# Patient Record
Sex: Female | Born: 1995 | Race: White | Hispanic: No | Marital: Married | Smoking: Never smoker
Health system: Southern US, Community
[De-identification: ages and names within clinical notes are randomized; demographics above are authoritative.]

## PROBLEM LIST (undated history)

## (undated) DIAGNOSIS — R630 Anorexia: Secondary | ICD-10-CM

## (undated) DIAGNOSIS — L509 Urticaria, unspecified: Secondary | ICD-10-CM

## (undated) DIAGNOSIS — F502 Bulimia nervosa, unspecified: Secondary | ICD-10-CM

## (undated) DIAGNOSIS — F419 Anxiety disorder, unspecified: Secondary | ICD-10-CM

## (undated) DIAGNOSIS — E559 Vitamin D deficiency, unspecified: Secondary | ICD-10-CM

## (undated) DIAGNOSIS — F32A Depression, unspecified: Secondary | ICD-10-CM

## (undated) DIAGNOSIS — F329 Major depressive disorder, single episode, unspecified: Secondary | ICD-10-CM

## (undated) DIAGNOSIS — R300 Dysuria: Secondary | ICD-10-CM

## (undated) HISTORY — PX: NO PAST SURGERIES: SHX2092

## (undated) HISTORY — PX: TONSILLECTOMY: SUR1361

## (undated) HISTORY — PX: ADENOIDECTOMY: SUR15

## (undated) HISTORY — PX: SINOSCOPY: SHX187

## (undated) HISTORY — DX: Dysuria: R30.0

## (undated) HISTORY — PX: TYMPANOSTOMY TUBE PLACEMENT: SHX32

## (undated) HISTORY — DX: Urticaria, unspecified: L50.9

## (undated) HISTORY — DX: Vitamin D deficiency, unspecified: E55.9

---
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
## 2016-07-13 ENCOUNTER — Encounter (INDEPENDENT_AMBULATORY_CARE_PROVIDER_SITE_OTHER): Payer: Self-pay

## 2016-07-13 ENCOUNTER — Ambulatory Visit (INDEPENDENT_AMBULATORY_CARE_PROVIDER_SITE_OTHER): Payer: 59 | Admitting: Allergy & Immunology

## 2016-07-13 ENCOUNTER — Encounter: Payer: Self-pay | Admitting: Allergy & Immunology

## 2016-07-13 VITALS — BP 96/62 | HR 103 | Temp 97.8°F | Resp 20 | Ht 61.5 in | Wt 100.2 lb

## 2016-07-13 DIAGNOSIS — L508 Other urticaria: Secondary | ICD-10-CM

## 2016-07-13 LAB — COMPLETE METABOLIC PANEL WITH GFR
ALBUMIN: 4.5 g/dL (ref 3.6–5.1)
ALK PHOS: 53 U/L (ref 33–115)
ALT: 18 U/L (ref 6–29)
AST: 18 U/L (ref 10–30)
BUN: 12 mg/dL (ref 7–25)
CHLORIDE: 106 mmol/L (ref 98–110)
CO2: 22 mmol/L (ref 20–31)
Calcium: 9.4 mg/dL (ref 8.6–10.2)
Creat: 0.6 mg/dL (ref 0.50–1.10)
GFR, Est African American: >89 mL/min (ref >=60)
GLUCOSE: 57 mg/dL — AB (ref 65–99)
POTASSIUM: 3.9 mmol/L (ref 3.5–5.3)
SODIUM: 140 mmol/L (ref 135–146)
Total Bilirubin: 0.4 mg/dL (ref 0.2–1.2)
Total Protein: 6.9 g/dL (ref 6.1–8.1)

## 2016-07-13 LAB — CBC WITH DIFFERENTIAL/PLATELET
BASOS ABS: 0 {cells}/uL (ref 0–200)
Basophils Relative: 0 %
EOS ABS: 42 {cells}/uL (ref 15–500)
EOS PCT: 1 %
HEMATOCRIT: 39.5 % (ref 35.0–45.0)
HEMOGLOBIN: 12.8 g/dL (ref 11.7–15.5)
LYMPHS ABS: 1680 {cells}/uL (ref 850–3900)
Lymphocytes Relative: 40 %
MCH: 27.6 pg (ref 27.0–33.0)
MCHC: 32.4 g/dL (ref 32.0–36.0)
MCV: 85.3 fL (ref 80.0–100.0)
MONO ABS: 336 {cells}/uL (ref 200–950)
MPV: 11.7 fL (ref 7.5–12.5)
Monocytes Relative: 8 %
NEUTROS ABS: 2142 {cells}/uL (ref 1500–7800)
NEUTROS PCT: 51 %
Platelets: 163 10*3/uL (ref 140–400)
RBC: 4.63 MIL/uL (ref 3.80–5.10)
RDW: 13.5 % (ref 11.0–15.0)
WBC: 4.2 10*3/uL (ref 3.8–10.8)

## 2016-07-13 NOTE — Patient Instructions (Addendum)
1. Chronic urticaria (hives) - Testing today showed: mildly positive reactions to ragweed, oak, and horse - These were not robust and I doubt they are related to your hives. - We will get labs to rule out serious causes of hives: CBC with Differential/Platelet, Sed Rate (ESR), C-reactive protein, Chronic Urticaria index, Tryptase, Complete Metabolic Panel - The history is not suggestive of a food trigger and she has tested negative to all of the major food allergens via blood testing (which has a good negative predictive value), therefore we did not perform additional food testing today. - There are no reg flags in the history such as fever, joint pains, permanent skin changes. - The first line treatment of chronic hives is suppressive dosing of antihistamines, so continue with Allegra (fexofenadine) 1-2 tablets in the morning and Xyzal (levocetirizine) 1-2 tablets at night. - Wean medications as tolerated at home. - If you do not want to take medications on a daily basis, we could discuss starting a monthly injectable medication call Xolair.  2. Return in about 3 months (around 10/11/2016).  Please inform us of any Emergency Department visits, hospitalizations, or changes in symptoms. Call us before going to the ED for breathing or allergy symptoms since we might be able to fit you in for a sick visit. Feel free to contact us anytime with any questions, problems, or concerns.  It was a pleasure to meet you and your family today! Have a wonderful holiday season!   Websites that have reliable patient information: 1. American Academy of Asthma, Allergy, and Immunology: www.aaaai.org 2. Food Allergy Research and Education (FARE): foodallergy.org 3. Mothers of Asthmatics: http://www.asthmacommunitynetwork.org 4. American College of Allergy, Asthma, and Immunology: www.acaai.org  Reducing Pollen Exposure  The American Academy of Allergy, Asthma and Immunology suggests the following steps to reduce  your exposure to pollen during allergy seasons.    1. Do not hang sheets or clothing out to dry; pollen may collect on these items. 2. Do not mow lawns or spend time around freshly cut grass; mowing stirs up pollen. 3. Keep windows closed at night.  Keep car windows closed while driving. 4. Minimize morning activities outdoors, a time when pollen counts are usually at their highest. 5. Stay indoors as much as possible when pollen counts or humidity is high and on windy days when pollen tends to remain in the air longer. 6. Use air conditioning when possible.  Many air conditioners have filters that trap the pollen spores. 7. Use a HEPA room air filter to remove pollen form the indoor air you breathe.

## 2016-07-13 NOTE — Progress Notes (Signed)
NEW PATIENT  Date of Service/Encounter:  07/13/16   Assessment:   Chronic urticaria  Plan/Recommendations:   1. Chronic urticaria (hives) - Testing today showed: mildly positive reactions to ragweed, oak, and horse - These were not robust and I doubt they are related to your hives. - We will get labs to rule out serious causes of hives: CBC with Differential/Platelet, Sed Rate (ESR), C-reactive protein, Chronic Urticaria index, Tryptase, Complete Metabolic Panel - The history is not suggestive of a food trigger and she has tested negative to all of the major food allergens via blood testing (which has a good negative predictive value), therefore we did not perform additional food testing today. - There are no reg flags in the history such as fever, joint pains, permanent skin changes. - The first line treatment of chronic hives is suppressive dosing of antihistamines, so continue with Allegra (fexofenadine) 1-2 tablets in the morning and Xyzal (levocetirizine) 1-2 tablets at night. - Wean medications as tolerated at home. - If you do not want to take medications on a daily basis, we could discuss starting a monthly injectable medication call Xolair.  2. Return in about 3 months (around 10/11/2016).    Subjective:   Stacie Eaton is a 20 y.o. female presenting today for evaluation of  Chief Complaint  Patient presents with  . Allergic Reaction    hard bumbs in hands and feet then hives on the rest of her body- last outbreak 3 days ago  .  Stacie Eaton has a history of the following: Patient Active Problem List   Diagnosis Date Noted  . Chronic urticaria 07/13/2016    History obtained from: chart review and patient and her mother.  Stacie Eaton was referred by Charline Bills, MD.     Stacie Eaton is a 20 y.o. female presenting for evaluation of urticaria. These have been ongoing for the past three months. She was hanging out at the house and they started out of nowhere. Right  before it happened, she was switched to a different birth control. But she switched back without any change. She was having no systemic symptoms. It was very pruritic but they do not have pictures. Rash started on the bottoms of her feet. It has since remained on the feet and hands with occasional spreading to the arms and the legs. Rash resolution leaves normal schools. t is definitely painful on the hands and the feet but itchy on the arms and legs. She has tried treating with Allegra one tablet once daily as well as ranitidine, one of each daily. This typically helps resolve the symptoms but has not resolved completely. She is having no fevers with these or joint pains. No one else in the family has these and she has never had hives before. She does not notice any worsening with foods but she notices that it only happens in her house. It resolves when she is outside of the home. She has two dogs at home as well as a rabbit. She has no mold or roach issues. She tolerates all of the major allergens.   The patient established care with Dr. Charline Bills in October 2017. At that time, according to his note, the rest (on for about 1 month. She was previously started on cimetidine and loratadine by another provider without resolution. She has not been seen by dermatology. She did have allergy testing to environmental allergens as well as foods sent in October 2017 which was negative to everything. I do not see  a recent CBC.  Otherwise, there is no history of other atopic diseases, including asthma, drug allergies, food allergies, environmental allergies, or stinging insect allergies. There is no significant infectious history. Vaccinations are up to date.    Past Medical History: Patient Active Problem List   Diagnosis Date Noted  . Chronic urticaria 07/13/2016    Medication List:    Medication List       Accurate as of 07/13/16 11:57 AM. Always use your most recent med list.          cimetidine  400 MG tablet Commonly known as:  TAGAMET Take 400 mg by mouth daily.   EPINEPHrine 0.3 mg/0.3 mL Soaj injection Commonly known as:  EPI-PEN Inject 0.3 mg into the muscle as needed.   fexofenadine 180 MG tablet Commonly known as:  ALLEGRA Take 180 mg by mouth daily.   LUTERA 0.1-20 MG-MCG tablet Generic drug:  levonorgestrel-ethinyl estradiol Take 20 mg by mouth daily.       Birth History: non-contributory. Born at term without complications.   Developmental History: Stacie Eaton has met all milestones on time. She has required no speech therapy, occupational therapy, or physical therapy.   Past Surgical History: Past Surgical History:  Procedure Laterality Date  . ADENOIDECTOMY    . SINOSCOPY    . TONSILLECTOMY    . TYMPANOSTOMY TUBE PLACEMENT       Family History: Family History  Problem Relation Age of Onset  . Allergic rhinitis Neg Hx   . Angioedema Neg Hx   . Asthma Neg Hx   . Eczema Neg Hx   . Immunodeficiency Neg Hx   . Urticaria Neg Hx    There is no family history of immunodeficiency or autoimmunity. There is no family history of urticaria at all.    Social History: Stacie Eaton lives at home with Her mother and father. They live in a house that is 76 years old. There is carpeting throughout the home. There is no mildew or roach problems in the home. She does not use dust mite covers on her better pillows. There is no tobacco exposure. There are 2 dogs and one rabbit in the home. She currently works as a Educational psychologist at [employer]. She is planning to go to the [employer] and get an undergraduate in Education officer, museum. She already has an associates degree which she obtained while she was in high school. She is planning to get a master's in botany.   Review of Systems: a 14-point review of systems is pertinent for what is mentioned in HPI.  Otherwise, all other systems were negative. Constitutional: negative other than that listed in the HPI Eyes: negative  other than that listed in the HPI Ears, nose, mouth, throat, and face: negative other than that listed in the HPI Respiratory: negative other than that listed in the HPI Cardiovascular: negative other than that listed in the HPI Gastrointestinal: negative other than that listed in the HPI Genitourinary: negative other than that listed in the HPI Integument: negative other than that listed in the HPI Hematologic: negative other than that listed in the HPI Musculoskeletal: negative other than that listed in the HPI Neurological: negative other than that listed in the HPI Allergy/Immunologic: negative other than that listed in the HPI    Objective:   Blood pressure 96/62, pulse (!) 103, temperature 97.8 F (36.6 C), temperature source Oral, resp. rate 20, height 5' 1.5" (1.562 m), weight 100 lb 3.2 oz (45.5 kg), SpO2 98 %. Body  mass index is 18.63 kg/m.   Physical Exam:  General: Alert, interactive, in no acute distress. Thin female. Cooperative with the exam. HEENT: TMs pearly gray, turbinates edematous without discharge, post-pharynx mildly erythematous. Neck: Supple without thyromegaly. Adenopathy: no enlarged lymph nodes appreciated in the anterior cervical, occipital, axillary, epitrochlear, inguinal, or popliteal regions Lungs: Clear to auscultation without wheezing, rhonchi or rales. No increased work of breathing. CV: Normal S1/S2, no murmurs. Capillary refill <2 seconds.  Abdomen: Nondistended, nontender. No guarding or rebound tenderness. Bowel sounds present in all fields and hyperactive  Skin: Warm and dry, without lesions or rashes. Dermatographism appreciated. There are no urticarial lesions appreciated today aside from when I performed dermatographism testing.  Extremities:  No clubbing, cyanosis or edema. Neuro:   Grossly intact.  Diagnostic studies:   Allergy Studies:   Indoor/Outdoor Percutaneous Adult Environmental Panel: mildly positive reactions to ragweed,  oak pollen, and horse with adequate controls     Salvatore Marvel, MD [facility]

## 2016-07-14 LAB — TRYPTASE: TRYPTASE: 3.4 ug/L (ref <11)

## 2016-07-14 LAB — C-REACTIVE PROTEIN: CRP: 0.4 mg/L (ref <8.0)

## 2016-07-14 LAB — SEDIMENTATION RATE: Sed Rate: 3 mm/hr (ref 0–20)

## 2016-07-21 LAB — CP CHRONIC URTICARIA INDEX PANEL
THYROID PEROXIDASE ANTIBODY: 1 [IU]/mL (ref <9)
TSH: 1.53 mIU/L

## 2016-10-12 ENCOUNTER — Ambulatory Visit: Payer: 59 | Admitting: Allergy & Immunology

## 2018-03-27 ENCOUNTER — Emergency Department (HOSPITAL_COMMUNITY)
Admission: EM | Admit: 2018-03-27 | Discharge: 2018-03-27 | Disposition: A | Discharge status: Home / Self Care | Payer: Self-pay | Attending: Emergency Medicine | Admitting: Emergency Medicine

## 2018-03-27 ENCOUNTER — Emergency Department (HOSPITAL_COMMUNITY): Payer: Self-pay

## 2018-03-27 ENCOUNTER — Other Ambulatory Visit: Payer: Self-pay

## 2018-03-27 ENCOUNTER — Encounter (HOSPITAL_COMMUNITY): Payer: Self-pay

## 2018-03-27 DIAGNOSIS — R0600 Dyspnea, unspecified: Secondary | ICD-10-CM | POA: Insufficient documentation

## 2018-03-27 DIAGNOSIS — R7989 Other specified abnormal findings of blood chemistry: Secondary | ICD-10-CM | POA: Insufficient documentation

## 2018-03-27 DIAGNOSIS — Z79899 Other long term (current) drug therapy: Secondary | ICD-10-CM | POA: Insufficient documentation

## 2018-03-27 LAB — CBC WITH DIFFERENTIAL/PLATELET
BASOS ABS: 0 10*3/uL (ref 0.0–0.1)
Basophils Relative: 0 %
EOS PCT: 2 %
Eosinophils Absolute: 0.1 10*3/uL (ref 0.0–0.7)
HEMATOCRIT: 39.6 % (ref 36.0–46.0)
Hemoglobin: 13.2 g/dL (ref 12.0–15.0)
LYMPHS ABS: 1.7 10*3/uL (ref 0.7–4.0)
Lymphocytes Relative: 32 %
MCH: 27.7 pg (ref 26.0–34.0)
MCHC: 33.3 g/dL (ref 30.0–36.0)
MCV: 83.2 fL (ref 78.0–100.0)
Monocytes Absolute: 0.3 10*3/uL (ref 0.1–1.0)
Monocytes Relative: 6 %
NEUTROS ABS: 3.3 10*3/uL (ref 1.7–7.7)
Neutrophils Relative %: 60 %
Platelets: 144 10*3/uL — ABNORMAL LOW (ref 150–400)
RBC: 4.76 MIL/uL (ref 3.87–5.11)
RDW: 13.2 % (ref 11.5–15.5)
WBC: 5.4 10*3/uL (ref 4.0–10.5)

## 2018-03-27 LAB — COMPREHENSIVE METABOLIC PANEL
ALK PHOS: 58 U/L (ref 38–126)
ALT: 16 U/L (ref 0–44)
AST: 17 U/L (ref 15–41)
Albumin: 4 g/dL (ref 3.5–5.0)
Anion gap: 10 (ref 5–15)
BILIRUBIN TOTAL: 0.4 mg/dL (ref 0.3–1.2)
BUN: 10 mg/dL (ref 6–20)
CALCIUM: 9 mg/dL (ref 8.9–10.3)
CO2: 23 mmol/L (ref 22–32)
CREATININE: 0.56 mg/dL (ref 0.44–1.00)
Chloride: 107 mmol/L (ref 98–111)
Glucose, Bld: 95 mg/dL (ref 70–99)
Potassium: 3.6 mmol/L (ref 3.5–5.1)
Sodium: 140 mmol/L (ref 135–145)
TOTAL PROTEIN: 7.3 g/dL (ref 6.5–8.1)

## 2018-03-27 LAB — I-STAT BETA HCG BLOOD, ED (NOT ORDERABLE)

## 2018-03-27 LAB — POCT I-STAT TROPONIN I: Troponin i, poc: 0 ng/mL (ref 0.00–0.08)

## 2018-03-27 LAB — D-DIMER, QUANTITATIVE (NOT AT ARMC): D DIMER QUANT: 0.56 ug{FEU}/mL — AB (ref 0.00–0.50)

## 2018-03-27 MED ORDER — IOPAMIDOL (ISOVUE-370) INJECTION 76%
INTRAVENOUS | Status: AC
  Filled 2018-03-27: qty 100

## 2018-03-27 MED ORDER — IOPAMIDOL (ISOVUE-370) INJECTION 76%
100.0000 mL | Freq: Once | INTRAVENOUS | Status: AC | PRN
  Administered 2018-03-27: 100 mL via INTRAVENOUS

## 2018-03-27 MED ORDER — ALBUTEROL SULFATE (2.5 MG/3ML) 0.083% IN NEBU
5.0000 mg | INHALATION_SOLUTION | Freq: Once | RESPIRATORY_TRACT | Status: DC
  Filled 2018-03-27: qty 6

## 2018-03-27 NOTE — ED Notes (Signed)
Pt updated on plan of care

## 2018-03-27 NOTE — Discharge Instructions (Signed)
Follow up with primary care doctor as needed.

## 2018-03-27 NOTE — ED Triage Notes (Signed)
Patient reports that she has had SOB x 1 week and has gotten progressively worse. Patient states she has SOB when walking across a room .

## 2018-03-27 NOTE — ED Provider Notes (Signed)
[facility]-EMERGENCY DEPT Provider Note   CSN: [id number] Arrival date & time: 03/27/18  1149     History   Chief Complaint Chief Complaint  Patient presents with  . Shortness of Breath    HPI Stacie Eaton is a 22 y.o. female.  The history is provided by the patient.  Shortness of Breath  This is a new problem. The average episode lasts 1 week. The problem occurs continuously.Episode onset: 1 week ago. The problem has been gradually worsening. Associated symptoms include rhinorrhea and cough. Pertinent negatives include no fever, no sputum production, no wheezing, no orthopnea, no syncope, no vomiting, no abdominal pain, no leg pain and no leg swelling. Associated symptoms comments: Exertional dypsnea only.  No dyspnea at rest.  No chest pain.  Over the last week Stacie Eaton has had a runny nose but no really cold like symptoms. It is unknown what precipitated the problem. Treatments tried: rest. The treatment provided no relief. Stacie Eaton has had no prior hospitalizations. Associated medical issues do not include asthma, PE, heart failure or DVT. Associated medical issues comments: pt does take OCP's.  last pregnant and delivered healthy baby 1 year ago..    Past Medical History:  Diagnosis Date  . Urticaria     Patient Active Problem List   Diagnosis Date Noted  . Chronic urticaria 07/13/2016    History reviewed. No pertinent surgical history.   OB History   None      Home Medications    Prior to Admission medications   Medication Sig Start Date End Date Taking? Authorizing Provider  norethindrone-ethinyl estradiol (JUNEL FE,GILDESS FE,LOESTRIN FE) 1-20 MG-MCG tablet Take 1 tablet by mouth daily.   Yes [provider]  Pseudoephedrine-APAP-DM (DAYQUIL PO) Take 2 tablets by mouth daily as needed (cold symptoms).   Yes [provider]  cimetidine (TAGAMET) 400 MG tablet Take 400 mg by mouth daily. 04/24/16   [provider]     Family History Family History  Problem Relation Age of Onset  . Allergic rhinitis Neg Hx   . Angioedema Neg Hx   . Asthma Neg Hx   . Eczema Neg Hx   . Immunodeficiency Neg Hx   . Urticaria Neg Hx     Social History Social History   Tobacco Use  . Smoking status: Never Smoker  . Smokeless tobacco: Never Used  Substance Use Topics  . Alcohol use: Yes    Comment: rarely  . Drug use: Never     Allergies   Patient has no known allergies.   Review of Systems Review of Systems  Constitutional: Negative for fever.  HENT: Positive for rhinorrhea.   Respiratory: Positive for cough and shortness of breath. Negative for sputum production and wheezing.   Cardiovascular: Negative for orthopnea, leg swelling and syncope.  Gastrointestinal: Negative for abdominal pain and vomiting.  All other systems reviewed and are negative.    Physical Exam Updated Vital Signs BP 119/85 (BP Location: Left Arm)   Pulse (!) 112   Temp 98.5 F (36.9 C) (Oral)   Resp (!) 24   Ht 5\' 1"  (1.549 m)   Wt 49.9 kg   LMP 03/13/2018   SpO2 97%   BMI 20.78 kg/m   Physical Exam  Constitutional: Stacie Eaton is oriented to person, place, and time. Stacie Eaton appears well-developed and well-nourished. No distress.  HENT:  Head: Normocephalic and atraumatic.  Mouth/Throat: Oropharynx is clear and moist.  Eyes: Pupils are equal, round, and reactive to light.  Conjunctivae and EOM are normal.  Neck: Normal range of motion. Neck supple.  Cardiovascular: Regular rhythm and intact distal pulses. Tachycardia present.  No murmur heard. Pulmonary/Chest: Effort normal and breath sounds normal. Tachypnea noted. No respiratory distress. Stacie Eaton has no wheezes. Stacie Eaton has no rales.  Mild decreased breath sounds in lower lobe but no wheezing present  Abdominal: Soft. Stacie Eaton exhibits no distension. There is no tenderness. There is no rebound and no guarding.  Musculoskeletal: Normal range of motion. Stacie Eaton exhibits no edema or  tenderness.  Neurological: Stacie Eaton is alert and oriented to person, place, and time.  Skin: Skin is warm and dry. No rash noted. No erythema.  Psychiatric: Stacie Eaton has a normal mood and affect. Her behavior is normal.  Nursing note and vitals reviewed.    ED Treatments / Results  Labs (all labs ordered are listed, but only abnormal results are displayed) Labs Reviewed  CBC WITH DIFFERENTIAL/PLATELET  COMPREHENSIVE METABOLIC PANEL  D-DIMER, QUANTITATIVE (NOT AT [facility])  I-STAT TROPONIN, ED  I-STAT BETA HCG BLOOD, ED ([facility], [facility], [facility] ONLY)    EKG EKG Interpretation  Date/Time:  Tuesday March 27 2018 12:05:56 EDT Ventricular Rate:  106 PR Interval:    QRS Duration: 85 QT Interval:  411 QTC Calculation: 546 R Axis:   83 Text Interpretation:  Sinus tachycardia Right atrial enlargement RSR' in V1 or V2, probably normal variant Borderline repolarization abnormality Prolonged QT interval No previous tracing Confirmed by Gwyneth SproutPlunkett, Iverson Sees ([id number]) on 03/27/2018 1:11:17 PM   Radiology Dg Chest 2 View  Result Date: 03/27/2018 CLINICAL DATA:  Short of breath for a week, progressively worsening especially with exertion EXAM: CHEST - 2 VIEW COMPARISON:  None. FINDINGS: No active infiltrate or effusion is seen. Rounded artifacts overlie the upper lungs and the left lower lateral chest. Mediastinal and hilar contours are unremarkable. The heart is within normal limits in size. No acute bony abnormality is seen. IMPRESSION: No active cardiopulmonary disease. Electronically Signed   By: Dwyane DeePaul  Barry M.D.   On: 03/27/2018 12:42    Procedures Procedures (including critical care time)  Medications Ordered in ED Medications - No data to display   Initial Impression / Assessment and Plan / ED Course  I have reviewed the triage vital signs and the nursing notes.  Pertinent labs & imaging results that were available during my care of the patient were reviewed by me and considered in my medical decision making  (see chart for details).    Healthy 22 year old female with a history of estrogen use presenting with 1 week of worsening exertional dyspnea.  Patient states Stacie Eaton feels better when Stacie Eaton lays down.  It is only exertional when Stacie Eaton feels short of breath.  Stacie Eaton denies any chest pain.  Stacie Eaton has had some runny nose over the last week but denies fever productive cough.  Stacie Eaton has no history of asthma or tobacco use.  Stacie Eaton has no lower extremity swelling.  Patient has no symptoms suggestive of heart failure, myocarditis.  Concern for possible PE as patient does have tachycardia, dyspnea will exertion and an abnormal EKG with some right heart strain.  Patient has no wheezing on exam but does have mildly decreased breath sounds in the lower lobe so this could be reactive.  CBC, BMP, troponin and d-dimer are pending.  Final Clinical Impressions(s) / ED Diagnoses   Final diagnoses:  None    ED Discharge Orders    None       Gwyneth SproutPlunkett, Lavender Stanke, MD 03/27/18 2025

## 2018-03-27 NOTE — ED Provider Notes (Signed)
  Physical Exam  BP 101/65 (BP Location: Right Arm)   Pulse 83   Temp 98.1 F (36.7 C) (Oral)   Resp 16   Ht 5\' 1"  (1.549 m)   Wt 49.9 kg   LMP 03/13/2018   SpO2 100%   BMI 20.78 kg/m   Physical Exam  ED Course/Procedures     Procedures  MDM  Received patient in signout.  Shortness of breath with exertion over the last week.  Work-up overall reassuring.  D-dimer mildly elevated.  CT scan done reassuring.  Will discharge home for outpatient follow-up.       Benjiman CorePickering, Xaiden Fleig, MD 03/27/18 [phone]

## 2018-07-23 ENCOUNTER — Other Ambulatory Visit (HOSPITAL_COMMUNITY): Payer: Self-pay | Admitting: Nurse Practitioner

## 2018-07-23 DIAGNOSIS — Z3A13 13 weeks gestation of pregnancy: Secondary | ICD-10-CM

## 2018-07-23 DIAGNOSIS — Z369 Encounter for antenatal screening, unspecified: Secondary | ICD-10-CM

## 2018-07-23 LAB — OB RESULTS CONSOLE ABO/RH: RH Type: POSITIVE

## 2018-07-23 LAB — OB RESULTS CONSOLE ANTIBODY SCREEN: Antibody Screen: NEGATIVE

## 2018-07-24 ENCOUNTER — Encounter (HOSPITAL_COMMUNITY): Payer: Self-pay | Admitting: *Deleted

## 2018-07-27 ENCOUNTER — Encounter (HOSPITAL_COMMUNITY): Payer: Self-pay

## 2018-07-27 ENCOUNTER — Ambulatory Visit (HOSPITAL_COMMUNITY)
Admission: RE | Admit: 2018-07-27 | Discharge: 2018-07-27 | Disposition: A | Discharge status: Home / Self Care | Payer: Self-pay | Source: Ambulatory Visit | Attending: Nurse Practitioner | Admitting: Nurse Practitioner

## 2018-07-27 ENCOUNTER — Other Ambulatory Visit (HOSPITAL_COMMUNITY): Payer: Self-pay

## 2018-07-27 HISTORY — DX: Major depressive disorder, single episode, unspecified: F32.9

## 2018-07-27 HISTORY — DX: Depression, unspecified: F32.A

## 2018-08-08 NOTE — L&D Delivery Note (Addendum)
Patient: Annastasia Haskins MRN: [id number]  GBS status: Negative  Patient is a 23 y.o. now G2P2002 s/p NSVD at [redacted]w[redacted]d, who was admitted for IOL due to [place]lig[place]. SROM prior to delivery at unknown time.   Delivery Note At 10:39 PM a viable female was delivered via Vaginal, Spontaneous (Presentation: R OA).  APGAR: 8, 9; weight pending.   Placenta status: Spontaneous, intact Cord: 3 vessel, intact with the following complications: nuchal cord x1.   Anesthesia: None for delivery, lidocaine for repair  Episiotomy:  None Lacerations: 2nd degree perineal  Suture Repair: 3.0 vicryl Est. Blood Loss (mL): 385  Head delivered ROA. Nuchal cord x1 present. Shoulder and body delivered in usual fashion. Infant with spontaneous cry, placed on mother's abdomen, dried and bulb suctioned. Cord clamped x 2 after 1-minute delay, and cut by family member. Cord blood drawn. Placenta delivered spontaneously with gentle cord traction. Fundus firm with massage and Pitocin. Perineum inspected and found to have 2nd degree laceration, which was repaired with 3.0 vicryl with good hemostasis achieved.  Mom to postpartum.  Baby to Couplet care / Skin to Skin.  Patriciaann Clan 02/18/2019, 11:24 PM  I was gloved and present for entire delivery SVD without incident No difficulty with shoulders Lacerations as listed above Repair of same supervised by me  Mallie Snooks, CNM 02/19/19 1:03 AM

## 2019-01-03 DIAGNOSIS — D696 Thrombocytopenia, unspecified: Secondary | ICD-10-CM | POA: Diagnosis not present

## 2019-01-03 DIAGNOSIS — Z3483 Encounter for supervision of other normal pregnancy, third trimester: Secondary | ICD-10-CM | POA: Diagnosis not present

## 2019-01-03 DIAGNOSIS — D649 Anemia, unspecified: Secondary | ICD-10-CM | POA: Diagnosis not present

## 2019-01-03 DIAGNOSIS — Z8659 Personal history of other mental and behavioral disorders: Secondary | ICD-10-CM | POA: Diagnosis not present

## 2019-01-03 DIAGNOSIS — Z23 Encounter for immunization: Secondary | ICD-10-CM | POA: Diagnosis not present

## 2019-01-03 DIAGNOSIS — Z130 Encounter for screening for diseases of the blood and blood-forming organs and certain disorders involving the immune mechanism: Secondary | ICD-10-CM | POA: Diagnosis not present

## 2019-01-03 DIAGNOSIS — B081 Molluscum contagiosum: Secondary | ICD-10-CM | POA: Diagnosis not present

## 2019-01-17 DIAGNOSIS — D696 Thrombocytopenia, unspecified: Secondary | ICD-10-CM | POA: Diagnosis not present

## 2019-01-17 DIAGNOSIS — D649 Anemia, unspecified: Secondary | ICD-10-CM | POA: Diagnosis not present

## 2019-01-17 DIAGNOSIS — Z23 Encounter for immunization: Secondary | ICD-10-CM | POA: Diagnosis not present

## 2019-01-17 DIAGNOSIS — Z3483 Encounter for supervision of other normal pregnancy, third trimester: Secondary | ICD-10-CM | POA: Diagnosis not present

## 2019-01-17 DIAGNOSIS — Z8659 Personal history of other mental and behavioral disorders: Secondary | ICD-10-CM | POA: Diagnosis not present

## 2019-01-17 DIAGNOSIS — B081 Molluscum contagiosum: Secondary | ICD-10-CM | POA: Diagnosis not present

## 2019-01-17 DIAGNOSIS — Z130 Encounter for screening for diseases of the blood and blood-forming organs and certain disorders involving the immune mechanism: Secondary | ICD-10-CM | POA: Diagnosis not present

## 2019-01-17 LAB — OB RESULTS CONSOLE GBS: GBS: NEGATIVE

## 2019-01-31 DIAGNOSIS — Z8659 Personal history of other mental and behavioral disorders: Secondary | ICD-10-CM | POA: Diagnosis not present

## 2019-01-31 DIAGNOSIS — Z130 Encounter for screening for diseases of the blood and blood-forming organs and certain disorders involving the immune mechanism: Secondary | ICD-10-CM | POA: Diagnosis not present

## 2019-01-31 DIAGNOSIS — D649 Anemia, unspecified: Secondary | ICD-10-CM | POA: Diagnosis not present

## 2019-01-31 DIAGNOSIS — Z3483 Encounter for supervision of other normal pregnancy, third trimester: Secondary | ICD-10-CM | POA: Diagnosis not present

## 2019-01-31 DIAGNOSIS — Z23 Encounter for immunization: Secondary | ICD-10-CM | POA: Diagnosis not present

## 2019-01-31 DIAGNOSIS — B081 Molluscum contagiosum: Secondary | ICD-10-CM | POA: Diagnosis not present

## 2019-01-31 DIAGNOSIS — D696 Thrombocytopenia, unspecified: Secondary | ICD-10-CM | POA: Diagnosis not present

## 2019-02-03 ENCOUNTER — Inpatient Hospital Stay (HOSPITAL_COMMUNITY)
Admission: AD | Admit: 2019-02-03 | Discharge: 2019-02-03 | Disposition: A | Discharge status: Home / Self Care | Payer: Medicaid Other | Attending: Obstetrics and Gynecology | Admitting: Obstetrics and Gynecology

## 2019-02-03 ENCOUNTER — Encounter (HOSPITAL_COMMUNITY): Payer: Self-pay | Admitting: *Deleted

## 2019-02-03 DIAGNOSIS — Z3A38 38 weeks gestation of pregnancy: Secondary | ICD-10-CM | POA: Diagnosis not present

## 2019-02-03 DIAGNOSIS — Z3A40 40 weeks gestation of pregnancy: Secondary | ICD-10-CM | POA: Diagnosis not present

## 2019-02-03 DIAGNOSIS — O471 False labor at or after 37 completed weeks of gestation: Secondary | ICD-10-CM | POA: Diagnosis not present

## 2019-02-03 NOTE — MAU Note (Signed)
Pt arrived in MAU with c/o mild contractions every 3-4 min for the past hour. She says they do not hurt, just feel tight in her abdomen. Denies any vag bleeding or leaking

## 2019-02-03 NOTE — Progress Notes (Signed)
S: Stacie Eaton is a 23 y.o. G2P1001 at [redacted]w[redacted]d  who presents to MAU today complaining contractions q 5-10 minutes. She denies vaginal bleeding. She denies LOF. She reports normal fetal movement.    O: BP (!) 103/54 (BP Location: Right Arm)   Pulse (!) 123   Temp 98.3 F (36.8 C) (Oral)   Resp 15   Ht 5\' 1"  (1.549 m)   Wt (!) 610.1 kg   LMP 04/27/2018   BMI 254.14 kg/m  GENERAL: Well-developed, well-nourished female in no acute distress.  HEAD: Normocephalic, atraumatic.  CHEST: Normal effort of breathing, regular heart rate ABDOMEN: Soft, nontender, gravid  Cervical exam:  Dilation: 4 Effacement (%): 80 Cervical Position: Posterior Station: -3 Presentation: Vertex Exam by:: Gilmer Mor RN   Fetal Monitoring: Baseline: 140 Variability: moderate Accelerations: 15x15 Decelerations: none Contractions: occasional uc's   A: SIUP at [redacted]w[redacted]d  False labor  P: Discharge home -Labor precautions discussed -Patient advised to follow-up with [facility] as scheduled for prenatal care -Patient may return to MAU as needed or if her condition were to change or worsen   Wende Mott, [place] 02/03/2019 8:49 PM

## 2019-02-03 NOTE — Discharge Instructions (Signed)
Braxton Hicks Contractions Contractions of the uterus can occur throughout pregnancy, but they are not always a sign that you are in labor. You may have practice contractions called Braxton Hicks contractions. These false labor contractions are sometimes confused with true labor. What are Braxton Hicks contractions? Braxton Hicks contractions are tightening movements that occur in the muscles of the uterus before labor. Unlike true labor contractions, these contractions do not result in opening (dilation) and thinning of the cervix. Toward the end of pregnancy (32-34 weeks), Braxton Hicks contractions can happen more often and may become stronger. These contractions are sometimes difficult to tell apart from true labor because they can be very uncomfortable. You should not feel embarrassed if you go to the hospital with false labor. Sometimes, the only way to tell if you are in true labor is for your health care provider to look for changes in the cervix. The health care provider will do a physical exam and may monitor your contractions. If you are not in true labor, the exam should show that your cervix is not dilating and your water has not broken. If there are no other health problems associated with your pregnancy, it is completely safe for you to be sent home with false labor. You may continue to have Braxton Hicks contractions until you go into true labor. How to tell the difference between true labor and false labor True labor  Contractions last 30-70 seconds.  Contractions become very regular.  Discomfort is usually felt in the top of the uterus, and it spreads to the lower abdomen and low back.  Contractions do not go away with walking.  Contractions usually become more intense and increase in frequency.  The cervix dilates and gets thinner. False labor  Contractions are usually shorter and not as strong as true labor contractions.  Contractions are usually irregular.  Contractions  are often felt in the front of the lower abdomen and in the groin.  Contractions may go away when you walk around or change positions while lying down.  Contractions get weaker and are shorter-lasting as time goes on.  The cervix usually does not dilate or become thin. Follow these instructions at home:   Take over-the-counter and prescription medicines only as told by your health care provider.  Keep up with your usual exercises and follow other instructions from your health care provider.  Eat and drink lightly if you think you are going into labor.  If Braxton Hicks contractions are making you uncomfortable: ? Change your position from lying down or resting to walking, or change from walking to resting. ? Sit and rest in a tub of warm water. ? Drink enough fluid to keep your urine pale yellow. Dehydration may cause these contractions. ? Do slow and deep breathing several times an hour.  Keep all follow-up prenatal visits as told by your health care provider. This is important. Contact a health care provider if:  You have a fever.  You have continuous pain in your abdomen. Get help right away if:  Your contractions become stronger, more regular, and closer together.  You have fluid leaking or gushing from your vagina.  You pass blood-tinged mucus (bloody show).  You have bleeding from your vagina.  You have low back pain that you never had before.  You feel your baby's head pushing down and causing pelvic pressure.  Your baby is not moving inside you as much as it used to. Summary  Contractions that occur before labor are   called Braxton Hicks contractions, false labor, or practice contractions.  Braxton Hicks contractions are usually shorter, weaker, farther apart, and less regular than true labor contractions. True labor contractions usually become progressively stronger and regular, and they become more frequent.  Manage discomfort from Braxton Hicks contractions  by changing position, resting in a warm bath, drinking plenty of water, or practicing deep breathing. This information is not intended to replace advice given to you by your health care provider. Make sure you discuss any questions you have with your health care provider. Document Released: 12/08/2016 Document Revised: 07/07/2017 Document Reviewed: 12/08/2016 Elsevier Patient Education  2020 Elsevier Inc.  

## 2019-02-04 ENCOUNTER — Inpatient Hospital Stay (HOSPITAL_COMMUNITY)
Admission: AD | Admit: 2019-02-04 | Discharge: 2019-02-04 | Disposition: A | Discharge status: Home / Self Care | Payer: Medicaid Other | Source: Ambulatory Visit | Attending: Obstetrics and Gynecology | Admitting: Obstetrics and Gynecology

## 2019-02-04 ENCOUNTER — Other Ambulatory Visit: Payer: Self-pay

## 2019-02-04 DIAGNOSIS — Z3A38 38 weeks gestation of pregnancy: Secondary | ICD-10-CM | POA: Diagnosis not present

## 2019-02-04 DIAGNOSIS — O471 False labor at or after 37 completed weeks of gestation: Secondary | ICD-10-CM | POA: Diagnosis not present

## 2019-02-04 NOTE — MAU Note (Signed)
CTX have intensified and gotten closer (2-3 mins apart).  No LOF/VB.  + FM.

## 2019-02-04 NOTE — Discharge Instructions (Signed)
Braxton Hicks Contractions Contractions of the uterus can occur throughout pregnancy, but they are not always a sign that you are in labor. You may have practice contractions called Braxton Hicks contractions. These false labor contractions are sometimes confused with true labor. What are Braxton Hicks contractions? Braxton Hicks contractions are tightening movements that occur in the muscles of the uterus before labor. Unlike true labor contractions, these contractions do not result in opening (dilation) and thinning of the cervix. Toward the end of pregnancy (32-34 weeks), Braxton Hicks contractions can happen more often and may become stronger. These contractions are sometimes difficult to tell apart from true labor because they can be very uncomfortable. You should not feel embarrassed if you go to the hospital with false labor. Sometimes, the only way to tell if you are in true labor is for your health care provider to look for changes in the cervix. The health care provider will do a physical exam and may monitor your contractions. If you are not in true labor, the exam should show that your cervix is not dilating and your water has not broken. If there are no other health problems associated with your pregnancy, it is completely safe for you to be sent home with false labor. You may continue to have Braxton Hicks contractions until you go into true labor. How to tell the difference between true labor and false labor True labor  Contractions last 30-70 seconds.  Contractions become very regular.  Discomfort is usually felt in the top of the uterus, and it spreads to the lower abdomen and low back.  Contractions do not go away with walking.  Contractions usually become more intense and increase in frequency.  The cervix dilates and gets thinner. False labor  Contractions are usually shorter and not as strong as true labor contractions.  Contractions are usually irregular.  Contractions  are often felt in the front of the lower abdomen and in the groin.  Contractions may go away when you walk around or change positions while lying down.  Contractions get weaker and are shorter-lasting as time goes on.  The cervix usually does not dilate or become thin. Follow these instructions at home:   Take over-the-counter and prescription medicines only as told by your health care provider.  Keep up with your usual exercises and follow other instructions from your health care provider.  Eat and drink lightly if you think you are going into labor.  If Braxton Hicks contractions are making you uncomfortable: ? Change your position from lying down or resting to walking, or change from walking to resting. ? Sit and rest in a tub of warm water. ? Drink enough fluid to keep your urine pale yellow. Dehydration may cause these contractions. ? Do slow and deep breathing several times an hour.  Keep all follow-up prenatal visits as told by your health care provider. This is important. Contact a health care provider if:  You have a fever.  You have continuous pain in your abdomen. Get help right away if:  Your contractions become stronger, more regular, and closer together.  You have fluid leaking or gushing from your vagina.  You pass blood-tinged mucus (bloody show).  You have bleeding from your vagina.  You have low back pain that you never had before.  You feel your baby's head pushing down and causing pelvic pressure.  Your baby is not moving inside you as much as it used to. Summary  Contractions that occur before labor are   called Braxton Hicks contractions, false labor, or practice contractions.  Braxton Hicks contractions are usually shorter, weaker, farther apart, and less regular than true labor contractions. True labor contractions usually become progressively stronger and regular, and they become more frequent.  Manage discomfort from Braxton Hicks contractions  by changing position, resting in a warm bath, drinking plenty of water, or practicing deep breathing. This information is not intended to replace advice given to you by your health care provider. Make sure you discuss any questions you have with your health care provider. Document Released: 12/08/2016 Document Revised: 07/07/2017 Document Reviewed: 12/08/2016 Elsevier Patient Education  2020 Elsevier Inc.  

## 2019-02-04 NOTE — Progress Notes (Signed)
S: Ms. Stacie Eaton is a 23 y.o. G2P1001 at [redacted]w[redacted]d  who presents to MAU today complaining contractions q 2-5 minutes that have worsened since she was seen last night. She denies vaginal bleeding. She denies LOF. She reports normal fetal movement.    O: BP 119/65   Pulse (!) 115   Temp 97.8 F (36.6 C)   Resp 17   Wt 60.9 kg   LMP 04/27/2018   SpO2 100%   BMI 25.35 kg/m  GENERAL: Well-developed, well-nourished female in no acute distress.  HEAD: Normocephalic, atraumatic.  CHEST: Normal effort of breathing, regular heart rate ABDOMEN: Soft, nontender, gravid  Cervical exam:  Dilation: 4 Effacement (%): 60, 70 Cervical Position: Posterior Station: -2 Presentation: Vertex Exam by:: Esau Grew, RN   Fetal Monitoring: Baseline: 125 Variability: moderate Accelerations: 15x15 Decelerations: none Contractions: 2-5  Patient given the option to walk and be rechecked in 1 hour or to go home. Patient states she wants to go home.   A: SIUP at [redacted]w[redacted]d  False labor  P: Discharge home -Labor precautions discussed -Patient advised to follow-up with [facility] as scheduled for prenatal care -Patient may return to MAU as needed or if her condition were to change or worsen   Wende Mott, [place] 02/04/2019 2:04 AM

## 2019-02-04 NOTE — MAU Note (Signed)
I have communicated with Len Blalock, CNM and reviewed vital signs:  Vitals:   02/04/19 0121 02/04/19 0202  BP: 119/65 111/63  Pulse: (!) 115 (!) 113  Resp: 17   Temp: 97.8 F (36.6 C)   SpO2: 100%     Vaginal exam:  Dilation: 4 Effacement (%): 60, 70 Cervical Position: Posterior Station: -2 Presentation: Vertex Exam by:: Esau Grew, RN,   Also reviewed contraction pattern and that non-stress test is reactive.  It has been documented that patient is contracting every 2-4 minutes with no cervical change since yesterday, not indicating active labor.  Patient denies any other complaints.  Based on this report provider has given order for discharge.  A discharge order and diagnosis entered by a provider.   Labor discharge instructions reviewed with patient.

## 2019-02-11 DIAGNOSIS — Z130 Encounter for screening for diseases of the blood and blood-forming organs and certain disorders involving the immune mechanism: Secondary | ICD-10-CM | POA: Diagnosis not present

## 2019-02-11 DIAGNOSIS — D649 Anemia, unspecified: Secondary | ICD-10-CM | POA: Diagnosis not present

## 2019-02-11 DIAGNOSIS — B081 Molluscum contagiosum: Secondary | ICD-10-CM | POA: Diagnosis not present

## 2019-02-11 DIAGNOSIS — Z8659 Personal history of other mental and behavioral disorders: Secondary | ICD-10-CM | POA: Diagnosis not present

## 2019-02-11 DIAGNOSIS — D696 Thrombocytopenia, unspecified: Secondary | ICD-10-CM | POA: Diagnosis not present

## 2019-02-11 DIAGNOSIS — Z23 Encounter for immunization: Secondary | ICD-10-CM | POA: Diagnosis not present

## 2019-02-11 DIAGNOSIS — Z3483 Encounter for supervision of other normal pregnancy, third trimester: Secondary | ICD-10-CM | POA: Diagnosis not present

## 2019-02-13 DIAGNOSIS — O480 Post-term pregnancy: Secondary | ICD-10-CM | POA: Diagnosis not present

## 2019-02-13 DIAGNOSIS — O365930 Maternal care for other known or suspected poor fetal growth, third trimester, not applicable or unspecified: Secondary | ICD-10-CM | POA: Diagnosis not present

## 2019-02-14 DIAGNOSIS — D696 Thrombocytopenia, unspecified: Secondary | ICD-10-CM | POA: Diagnosis not present

## 2019-02-14 DIAGNOSIS — Z130 Encounter for screening for diseases of the blood and blood-forming organs and certain disorders involving the immune mechanism: Secondary | ICD-10-CM | POA: Diagnosis not present

## 2019-02-14 DIAGNOSIS — O480 Post-term pregnancy: Secondary | ICD-10-CM | POA: Diagnosis not present

## 2019-02-14 DIAGNOSIS — B081 Molluscum contagiosum: Secondary | ICD-10-CM | POA: Diagnosis not present

## 2019-02-14 DIAGNOSIS — D649 Anemia, unspecified: Secondary | ICD-10-CM | POA: Diagnosis not present

## 2019-02-14 DIAGNOSIS — Z8659 Personal history of other mental and behavioral disorders: Secondary | ICD-10-CM | POA: Diagnosis not present

## 2019-02-14 DIAGNOSIS — Z3483 Encounter for supervision of other normal pregnancy, third trimester: Secondary | ICD-10-CM | POA: Diagnosis not present

## 2019-02-14 DIAGNOSIS — Z23 Encounter for immunization: Secondary | ICD-10-CM | POA: Diagnosis not present

## 2019-02-15 ENCOUNTER — Telehealth (HOSPITAL_COMMUNITY): Payer: Self-pay | Admitting: *Deleted

## 2019-02-15 NOTE — Telephone Encounter (Signed)
Preadmission screen  

## 2019-02-18 ENCOUNTER — Encounter (HOSPITAL_COMMUNITY): Payer: Self-pay | Admitting: *Deleted

## 2019-02-18 ENCOUNTER — Inpatient Hospital Stay (HOSPITAL_COMMUNITY)
Admission: AD | Admit: 2019-02-18 | Discharge: 2019-02-20 | DRG: 806 | Disposition: A | Discharge status: Home / Self Care | Payer: Medicaid Other | Attending: Obstetrics and Gynecology | Admitting: Obstetrics and Gynecology

## 2019-02-18 ENCOUNTER — Other Ambulatory Visit: Payer: Self-pay

## 2019-02-18 ENCOUNTER — Encounter (HOSPITAL_COMMUNITY): Payer: Self-pay | Admitting: General Practice

## 2019-02-18 ENCOUNTER — Telehealth (HOSPITAL_COMMUNITY): Payer: Self-pay | Admitting: *Deleted

## 2019-02-18 DIAGNOSIS — D649 Anemia, unspecified: Secondary | ICD-10-CM | POA: Diagnosis not present

## 2019-02-18 DIAGNOSIS — Z23 Encounter for immunization: Secondary | ICD-10-CM | POA: Diagnosis not present

## 2019-02-18 DIAGNOSIS — O480 Post-term pregnancy: Secondary | ICD-10-CM | POA: Diagnosis present

## 2019-02-18 DIAGNOSIS — Z3483 Encounter for supervision of other normal pregnancy, third trimester: Secondary | ICD-10-CM | POA: Diagnosis not present

## 2019-02-18 DIAGNOSIS — O9912 Other diseases of the blood and blood-forming organs and certain disorders involving the immune mechanism complicating childbirth: Secondary | ICD-10-CM | POA: Diagnosis not present

## 2019-02-18 DIAGNOSIS — O4103X0 Oligohydramnios, third trimester, not applicable or unspecified: Secondary | ICD-10-CM | POA: Diagnosis not present

## 2019-02-18 DIAGNOSIS — Z3A40 40 weeks gestation of pregnancy: Secondary | ICD-10-CM

## 2019-02-18 DIAGNOSIS — O4100X0 Oligohydramnios, unspecified trimester, not applicable or unspecified: Secondary | ICD-10-CM | POA: Diagnosis present

## 2019-02-18 DIAGNOSIS — D696 Thrombocytopenia, unspecified: Secondary | ICD-10-CM

## 2019-02-18 DIAGNOSIS — Z130 Encounter for screening for diseases of the blood and blood-forming organs and certain disorders involving the immune mechanism: Secondary | ICD-10-CM | POA: Diagnosis not present

## 2019-02-18 DIAGNOSIS — B081 Molluscum contagiosum: Secondary | ICD-10-CM | POA: Diagnosis not present

## 2019-02-18 DIAGNOSIS — Z8659 Personal history of other mental and behavioral disorders: Secondary | ICD-10-CM | POA: Diagnosis not present

## 2019-02-18 DIAGNOSIS — Z1159 Encounter for screening for other viral diseases: Secondary | ICD-10-CM

## 2019-02-18 DIAGNOSIS — D6959 Other secondary thrombocytopenia: Secondary | ICD-10-CM | POA: Diagnosis not present

## 2019-02-18 LAB — CBC
HCT: 37.9 % (ref 36.0–46.0)
Hemoglobin: 11.6 g/dL — ABNORMAL LOW (ref 12.0–15.0)
MCH: 24.9 pg — ABNORMAL LOW (ref 26.0–34.0)
MCHC: 30.6 g/dL (ref 30.0–36.0)
MCV: 81.3 fL (ref 80.0–100.0)
Platelets: 133 10*3/uL — ABNORMAL LOW (ref 150–400)
RBC: 4.66 MIL/uL (ref 3.87–5.11)
RDW: 20.9 % — ABNORMAL HIGH (ref 11.5–15.5)
WBC: 9.1 10*3/uL (ref 4.0–10.5)
nRBC: 0 % (ref 0.0–0.2)

## 2019-02-18 LAB — TYPE AND SCREEN
ABO/RH(D): A POS
Antibody Screen: NEGATIVE

## 2019-02-18 LAB — SARS CORONAVIRUS 2 BY RT PCR (HOSPITAL ORDER, PERFORMED IN ~~LOC~~ HOSPITAL LAB): SARS Coronavirus 2: NEGATIVE

## 2019-02-18 MED ORDER — OXYTOCIN 40 UNITS IN NORMAL SALINE INFUSION - SIMPLE MED
2.5000 [IU]/h | INTRAVENOUS | Status: DC
  Administered 2019-02-18: 23:00:00 2.5 [IU]/h via INTRAVENOUS

## 2019-02-18 MED ORDER — OXYTOCIN BOLUS FROM INFUSION
500.0000 mL | Freq: Once | INTRAVENOUS | Status: AC
  Administered 2019-02-18: 23:00:00 500 mL via INTRAVENOUS

## 2019-02-18 MED ORDER — ONDANSETRON HCL 4 MG/2ML IJ SOLN: 4.0000 mg | Freq: Four times a day (QID) | INTRAMUSCULAR | Status: DC | PRN

## 2019-02-18 MED ORDER — OXYCODONE-ACETAMINOPHEN 5-325 MG PO TABS: 2.0000 | ORAL_TABLET | ORAL | Status: DC | PRN

## 2019-02-18 MED ORDER — LACTATED RINGERS IV SOLN: 500.0000 mL | INTRAVENOUS | Status: DC | PRN

## 2019-02-18 MED ORDER — FENTANYL CITRATE (PF) 100 MCG/2ML IJ SOLN
100.0000 ug | INTRAMUSCULAR | Status: DC | PRN
  Administered 2019-02-18 (×2): 100 ug via INTRAVENOUS
  Filled 2019-02-18 (×2): qty 2

## 2019-02-18 MED ORDER — OXYCODONE-ACETAMINOPHEN 5-325 MG PO TABS: 1.0000 | ORAL_TABLET | ORAL | Status: DC | PRN

## 2019-02-18 MED ORDER — ACETAMINOPHEN 325 MG PO TABS: 650.0000 mg | ORAL_TABLET | ORAL | Status: DC | PRN

## 2019-02-18 MED ORDER — LACTATED RINGERS IV SOLN
INTRAVENOUS | Status: DC
  Administered 2019-02-18: 19:00:00 via INTRAVENOUS

## 2019-02-18 MED ORDER — OXYTOCIN 40 UNITS IN NORMAL SALINE INFUSION - SIMPLE MED
1.0000 m[IU]/min | INTRAVENOUS | Status: DC
  Administered 2019-02-18: 2 m[IU]/min via INTRAVENOUS

## 2019-02-18 MED ORDER — OXYTOCIN 40 UNITS IN NORMAL SALINE INFUSION - SIMPLE MED
INTRAVENOUS | Status: AC
  Administered 2019-02-18: 23:00:00 500 mL via INTRAVENOUS
  Filled 2019-02-18: qty 1000

## 2019-02-18 MED ORDER — FLEET ENEMA 7-19 GM/118ML RE ENEM: 1.0000 | ENEMA | RECTAL | Status: DC | PRN

## 2019-02-18 MED ORDER — TERBUTALINE SULFATE 1 MG/ML IJ SOLN: 0.2500 mg | Freq: Once | INTRAMUSCULAR | Status: DC | PRN

## 2019-02-18 MED ORDER — LIDOCAINE HCL (PF) 1 % IJ SOLN
30.0000 mL | INTRAMUSCULAR | Status: DC | PRN
  Administered 2019-02-18: 30 mL via SUBCUTANEOUS
  Filled 2019-02-18: qty 30

## 2019-02-18 MED ORDER — SOD CITRATE-CITRIC ACID 500-334 MG/5ML PO SOLN: 30.0000 mL | ORAL | Status: DC | PRN

## 2019-02-18 NOTE — Plan of Care (Signed)
  Problem: Health Behavior/Discharge Planning: Goal: Ability to manage health-related needs will improve Outcome: Progressing   Problem: Clinical Measurements: Goal: Will remain free from infection Outcome: Progressing   Problem: Coping: Goal: Level of anxiety will decrease Outcome: Progressing   Problem: Pain Managment: Goal: General experience of comfort will improve Outcome: Progressing   Problem: Education: Goal: Knowledge of Childbirth will improve Outcome: Completed/Met Goal: Ability to make informed decisions regarding treatment and plan of care will improve Outcome: Completed/Met Goal: Ability to state and carry out methods to decrease the pain will improve Outcome: Completed/Met   Problem: Coping: Goal: Ability to verbalize concerns and feelings about labor and delivery will improve Outcome: Completed/Met   Problem: Life Cycle: Goal: Ability to make normal progression through stages of labor will improve Outcome: Completed/Met Goal: Ability to effectively push during vaginal delivery will improve Outcome: Completed/Met   Problem: Role Relationship: Goal: Will demonstrate positive interactions with the child Outcome: Completed/Met   Problem: Safety: Goal: Risk of complications during labor and delivery will decrease Outcome: Completed/Met   Problem: Pain Management: Goal: Relief or control of pain from uterine contractions will improve Outcome: Completed/Met   Problem: Education: Goal: Knowledge of General Education information will improve Description: Including pain rating scale, medication(s)/side effects and non-pharmacologic comfort measures Outcome: Completed/Met   Problem: Clinical Measurements: Goal: Ability to maintain clinical measurements within normal limits will improve Outcome: Completed/Met

## 2019-02-18 NOTE — Telephone Encounter (Signed)
Preadmission screen  

## 2019-02-18 NOTE — H&P (Addendum)
LABOR AND DELIVERY ADMISSION HISTORY AND PHYSICAL NOTE  Stacie Eaton is a 23 y.o. female G2P1001 with IUP at 3756w5d by LMP presenting for induction of labor secondary to oligohydramnios. Patient presented to health department today for monitoring due to postdates and was sent here for induction due to above findings. She reports positive fetal movement and is feeling her contractions. She denies leakage of fluid or vaginal bleeding. No other concerns at this time.  Prenatal History/Complications: PNC at health department Pregnancy complications:  - Oligohydramnios - gestational thrombocytopenia, platelets 133 at admission  -h/o PP depression   Past Medical History: Past Medical History:  Diagnosis Date  . Depression    postpartum  . Urticaria     Past Surgical History: Past Surgical History:  Procedure Laterality Date  . NO PAST SURGERIES      Obstetrical History: OB History    Gravida  2   Para  1   Term  1   Preterm      AB      Living  1     SAB      TAB      Ectopic      Multiple      Live Births              Social History: Social History   Socioeconomic History  . Marital status: Single    Spouse name: Not on file  . Number of children: Not on file  . Years of education: Not on file  . Highest education level: Not on file  Occupational History  . Not on file  Social Needs  . Financial resource strain: Not hard at all  . Food insecurity    Worry: Never true    Inability: Never true  . Transportation needs    Medical: No    Non-medical: Not on file  Tobacco Use  . Smoking status: Never Smoker  . Smokeless tobacco: Never Used  Substance and Sexual Activity  . Alcohol use: Not Currently    Comment: rarely  . Drug use: Never  . Sexual activity: Not Currently  Lifestyle  . Physical activity    Days per week: Not on file    Minutes per session: Not on file  . Stress: Only a little  Relationships  . Social Musicianconnections    Talks on  phone: Not on file    Gets together: Not on file    Attends religious service: Not on file    Active member of club or organization: Not on file    Attends meetings of clubs or organizations: Not on file    Relationship status: Not on file  Other Topics Concern  . Not on file  Social History Narrative  . Not on file    Family History: Family History  Problem Relation Age of Onset  . Allergic rhinitis Neg Hx   . Angioedema Neg Hx   . Asthma Neg Hx   . Eczema Neg Hx   . Immunodeficiency Neg Hx   . Urticaria Neg Hx     Allergies: No Known Allergies  Medications Prior to Admission  Medication Sig Dispense Refill Last Dose  . cimetidine (TAGAMET) 400 MG tablet Take 400 mg by mouth daily.     . ferrous sulfate 325 (65 FE) MG EC tablet Take 325 mg by mouth 3 (three) times daily with meals.     . Prenatal Multivit-Min-Fe-FA (PRENATAL VITAMINS PO) Take by mouth.     .Marland Kitchen  Pseudoephedrine-APAP-DM (DAYQUIL PO) Take 2 tablets by mouth daily as needed (cold symptoms).        Review of Systems  All systems reviewed and negative except as stated in HPI  Physical Exam Blood pressure 107/62, pulse 97, temperature 98.8 F (37.1 C), temperature source Oral, resp. rate 18, height 5\' 1"  (1.549 m), weight 62.1 kg, last menstrual period 04/27/2018. General appearance: Alert, oriented, NAD Lungs: Normal respiratory effort Heart: Regular rate Abdomen: Soft, non-tender; gravid, FH appropriate for GA Extremities: No calf swelling or tenderness Presentation: Cephalic by SVE Fetal monitoring: Baseline 145, moderate variability, +accels, -decels Uterine activity: Contractions every 3 minutes Dilation: 5 Effacement (%): 70 Station: -2 Exam by:: Krystal Eaton RN   Prenatal labs: ABO, Rh: A Positive  Antibody:Negative  Rubella:  Immune RPR:   NR HBsAg:   Negative HIV:   NR in 1st trimester GC/Chlamydia: Negative GBS: Negative (06/11 0000)  1-hr GTT: 89 Genetic screening: Quad screen  negative Anatomy US: Normal growth  Prenatal Transfer Tool  Maternal Diabetes: No Genetic Screening: Normal Maternal Ultrasounds/Referrals: Other: Oligohydramnios via [place] today Fetal Ultrasounds or other Referrals:  None Maternal Substance Abuse:  No Significant Maternal Medications:  None Significant Maternal Lab Results: Group B Strep negative  Results for orders placed or performed during the hospital encounter of 02/18/19 (from the past 24 hour(s))  SARS Coronavirus 2 (CEPHEID - Performed in [place] hospital lab), [facility] Order   Collection Time: 02/18/19  5:30 PM   Specimen: Nasopharyngeal Swab  Result Value Ref Range   SARS Coronavirus 2 NEGATIVE NEGATIVE  CBC   Collection Time: 02/18/19  6:22 PM  Result Value Ref Range   WBC 9.1 4.0 - 10.5 K/uL   RBC 4.66 3.87 - 5.11 MIL/uL   Hemoglobin 11.6 (L) 12.0 - 15.0 g/dL   HCT 37.9 36.0 - 46.0 %   MCV 81.3 80.0 - 100.0 fL   MCH 24.9 (L) 26.0 - 34.0 pg   MCHC 30.6 30.0 - 36.0 g/dL   RDW 20.9 (H) 11.5 - 15.5 %   Platelets 133 (L) 150 - 400 K/uL   nRBC 0.0 0.0 - 0.2 %  Type and screen [place]   Collection Time: 02/18/19  6:30 PM  Result Value Ref Range   ABO/RH(D) PENDING    Antibody Screen PENDING    Sample Expiration      02/21/2019,2359 Performed at [facility], [place] [address], [place], [place] [address]     Patient Active Problem List   Diagnosis Date Noted  . Oligohydramnios 02/18/2019  . Chronic urticaria 07/13/2016    Assessment: Stacie Eaton is a 23 y.o. G2P1001 at [redacted]w[redacted]d here for IOL for oligohydramnios.  #Labor: Cervix favorable at admission. Patient reports she has been 4-5 cm for several weeks. Augmentation of labor with Pitocin, will titrate per protocol. #Pain: IV pain medicine PRN. #FWB:  Category 1 tracing as above. #ID:  GBS negative; no antibiotics needed. #MOF:  Breast #MOC:  OCPs  Melina Schools 02/18/2019, 7:18 PM   OB FELLOW HISTORY AND PHYSICAL  ATTESTATION  I have seen and examined this patient; I agree with above documentation in the resident's note.   Phill Myron, D.O. OB Fellow  02/18/2019, 7:19 PM

## 2019-02-18 NOTE — Discharge Summary (Signed)
Postpartum Discharge Summary     Patient Name: Stacie Eaton DOB: July 14, 1996 MRN: [id number]  Date of admission: 02/18/2019 Delivering Provider: Patriciaann Clan   Date of discharge: 02/20/2019  Admitting diagnosis: pregnancy Intrauterine pregnancy: [redacted]w[redacted]d     Secondary diagnosis:  Active Problems:   Oligohydramnios   SVD (spontaneous vaginal delivery)   Gestational thrombocytopenia [facility])  Additional problems: None      Discharge diagnosis: Term Pregnancy Delivered                                                                                                Post partum procedures:None   Augmentation: Pitocin  Complications: None  Hospital course:  Induction of Labor With Vaginal Delivery   23 y.o. yo G2P1001 at [redacted]w[redacted]d was admitted to the hospital 02/18/2019 for induction of labor.  Indication for induction: Oligohydramnios .  Patient had an uncomplicated labor course as follows: Membrane Rupture Time/Date:  ,   Intrapartum Procedures: Episiotomy: None [1]                                         Lacerations:  2nd degree [3]  Patient had delivery of a Viable infant.  Information for the patient's newborn:  Breonia, Kirstein [[id number]]  Delivery Method: Vaginal, Spontaneous(Filed from Delivery Summary)    02/18/2019  Details of delivery can be found in separate delivery note.  Patient had a routine postpartum course. Patient is discharged home 02/20/19.  Magnesium Sulfate recieved: No BMZ received: No  Physical exam  Vitals:   02/19/19 1020 02/19/19 1440 02/19/19 2205 02/20/19 0614  BP: 110/70 124/65 112/65 (!) 91/45  Pulse: 90 99 87 88  Resp: 18 18 16 16   Temp: 98 F (36.7 C) 98.3 F (36.8 C) 98.2 F (36.8 C) 98.2 F (36.8 C)  TempSrc: Oral Oral Oral Oral  SpO2:   98% 99%  Weight:      Height:       General: alert and cooperative Lochia: appropriate Uterine Fundus: firm Incision: N/A DVT Evaluation: No evidence of DVT seen on physical exam. Labs: Lab  Results  Component Value Date   WBC 9.1 02/18/2019   HGB 11.6 (L) 02/18/2019   HCT 37.9 02/18/2019   MCV 81.3 02/18/2019   PLT 133 (L) 02/18/2019   CMP Latest Ref Rng & Units 03/27/2018  Glucose 70 - 99 mg/dL 95  BUN 6 - 20 mg/dL 10  Creatinine 0.44 - 1.00 mg/dL 0.56  Sodium 135 - 145 mmol/L 140  Potassium 3.5 - 5.1 mmol/L 3.6  Chloride 98 - 111 mmol/L 107  CO2 22 - 32 mmol/L 23  Calcium 8.9 - 10.3 mg/dL 9.0  Total Protein 6.5 - 8.1 g/dL 7.3  Total Bilirubin 0.3 - 1.2 mg/dL 0.4  Alkaline Phos 38 - 126 U/L 58  AST 15 - 41 U/L 17  ALT 0 - 44 U/L 16    Discharge instruction: per After Visit Summary and "Baby and Me Booklet".  After visit meds:  Allergies as of 02/20/2019   No Known Allergies     Medication List    TAKE these medications   cimetidine 400 MG tablet Commonly known as: TAGAMET Take 400 mg by mouth daily.   DAYQUIL PO Take 2 tablets by mouth daily as needed (cold symptoms).   ferrous sulfate 325 (65 FE) MG EC tablet Take 325 mg by mouth 3 (three) times daily with meals.   ibuprofen 600 MG tablet Commonly known as: ADVIL Take 1 tablet (600 mg total) by mouth every 6 (six) hours.   PRENATAL VITAMINS PO Take by mouth.   senna-docusate 8.6-50 MG tablet Commonly known as: Senokot-S Take 2 tablets by mouth daily. Start taking on: February 21, 2019       Diet: routine diet  Activity: Advance as tolerated. Pelvic rest for 6 weeks.   Outpatient follow up:4 weeks Follow up Appt: No future appointments. Follow up Visit: Follow-up Information    [facility] Schedule an appointment as soon as possible for a visit in 4 week(s).   Why: Postpartum follow up  Contact information: [address] [place] [id number] [phone]            Newborn Data: Live born female  Birth Weight:  3580g APGAR: 8, 9  Newborn Delivery   Birth date/time: 02/18/2019 22:39:00 Delivery type: Vaginal, Spontaneous      Baby Feeding:  Breast Disposition:home with mother   02/20/2019 De Hollingsheadatherine L Tiara Bartoli, DO

## 2019-02-19 ENCOUNTER — Other Ambulatory Visit (HOSPITAL_COMMUNITY)
Admission: RE | Admit: 2019-02-19 | Discharge: 2019-02-19 | Disposition: A | Discharge status: Home / Self Care | Payer: Medicaid Other | Source: Ambulatory Visit | Attending: Obstetrics and Gynecology | Admitting: Obstetrics and Gynecology

## 2019-02-19 LAB — ABO/RH: ABO/RH(D): A POS

## 2019-02-19 LAB — RPR: RPR Ser Ql: NONREACTIVE

## 2019-02-19 MED ORDER — IBUPROFEN 600 MG PO TABS
600.0000 mg | ORAL_TABLET | Freq: Four times a day (QID) | ORAL | Status: DC
  Administered 2019-02-19 – 2019-02-20 (×6): 600 mg via ORAL
  Filled 2019-02-19 (×6): qty 1

## 2019-02-19 MED ORDER — ZOLPIDEM TARTRATE 5 MG PO TABS: 5.0000 mg | ORAL_TABLET | Freq: Every evening | ORAL | Status: DC | PRN

## 2019-02-19 MED ORDER — DIBUCAINE (PERIANAL) 1 % EX OINT: 1.0000 "application " | TOPICAL_OINTMENT | CUTANEOUS | Status: DC | PRN

## 2019-02-19 MED ORDER — TETANUS-DIPHTH-ACELL PERTUSSIS 5-2.5-18.5 LF-MCG/0.5 IM SUSP: 0.5000 mL | Freq: Once | INTRAMUSCULAR | Status: DC

## 2019-02-19 MED ORDER — WITCH HAZEL-GLYCERIN EX PADS: 1.0000 "application " | MEDICATED_PAD | CUTANEOUS | Status: DC | PRN

## 2019-02-19 MED ORDER — SENNOSIDES-DOCUSATE SODIUM 8.6-50 MG PO TABS
2.0000 | ORAL_TABLET | ORAL | Status: DC
  Administered 2019-02-19 – 2019-02-20 (×2): 2 via ORAL
  Filled 2019-02-19 (×2): qty 2

## 2019-02-19 MED ORDER — ACETAMINOPHEN 325 MG PO TABS: 650.0000 mg | ORAL_TABLET | ORAL | Status: DC | PRN

## 2019-02-19 MED ORDER — SODIUM CHLORIDE 0.9 % IV SOLN: 250.0000 mL | INTRAVENOUS | Status: DC | PRN

## 2019-02-19 MED ORDER — BENZOCAINE-MENTHOL 20-0.5 % EX AERO
1.0000 "application " | INHALATION_SPRAY | CUTANEOUS | Status: DC | PRN
  Administered 2019-02-19: 1 via TOPICAL
  Filled 2019-02-19: qty 56

## 2019-02-19 MED ORDER — ONDANSETRON HCL 4 MG/2ML IJ SOLN: 4.0000 mg | INTRAMUSCULAR | Status: DC | PRN

## 2019-02-19 MED ORDER — SODIUM CHLORIDE 0.9% FLUSH: 3.0000 mL | Freq: Two times a day (BID) | INTRAVENOUS | Status: DC

## 2019-02-19 MED ORDER — SODIUM CHLORIDE 0.9% FLUSH: 3.0000 mL | INTRAVENOUS | Status: DC | PRN

## 2019-02-19 MED ORDER — PRENATAL MULTIVITAMIN CH
1.0000 | ORAL_TABLET | Freq: Every day | ORAL | Status: DC
  Administered 2019-02-19 – 2019-02-20 (×2): 1 via ORAL
  Filled 2019-02-19 (×2): qty 1

## 2019-02-19 MED ORDER — SIMETHICONE 80 MG PO CHEW
80.0000 mg | CHEWABLE_TABLET | ORAL | Status: DC | PRN
  Administered 2019-02-19: 18:00:00 80 mg via ORAL

## 2019-02-19 MED ORDER — COCONUT OIL OIL
1.0000 "application " | TOPICAL_OIL | Status: DC | PRN
  Administered 2019-02-19: 1 via TOPICAL

## 2019-02-19 MED ORDER — MEASLES, MUMPS & RUBELLA VAC IJ SOLR: 0.5000 mL | Freq: Once | INTRAMUSCULAR | Status: DC

## 2019-02-19 MED ORDER — ONDANSETRON HCL 4 MG PO TABS: 4.0000 mg | ORAL_TABLET | ORAL | Status: DC | PRN

## 2019-02-19 MED ORDER — DIPHENHYDRAMINE HCL 25 MG PO CAPS: 25.0000 mg | ORAL_CAPSULE | Freq: Four times a day (QID) | ORAL | Status: DC | PRN

## 2019-02-19 NOTE — Progress Notes (Signed)
CSW received consult for history of PPD.  CSW met with MOB to offer support and complete assessment.    MOB sitting up in bed holding infant, when CSW entered the room. FOB also present and at bedside. CSW introduced self and received verbal permission from MOB to complete assessment with FOB present. CSW explained reason for consult to which MOB expressed understanding. 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 MOB described symptoms of general sadness and feeling sluggish that lasted for about 9 months. MOB denied taking any medications or participating in counseling at that time but reported having good support. MOB did acknowledge some anxiety during pregnancy but reported she currently feels good. MOB denied any interest in medications or counseling at this time but feels comfortable reaching out if needed. CSW provided education regarding the baby blues period vs. perinatal mood disorders, discussed treatment and gave resources for mental health follow up if concerns arise.  CSW recommends self-evaluation during the postpartum time period using the New Mom Checklist from Postpartum Progress and encouraged MOB to contact a medical professional if symptoms are noted at any time. MOB denied any current SI or HI and reported having good support from FOB and a lot of her family.  MOB reported having all essential items for infant once discharged and stated infant would be sleeping in a pack 'n' play once home. CSW provided review of Sudden Infant Death Syndrome (SIDS) precautions.    CSW identifies no further need for intervention and no barriers to discharge at this time.  Elijio Miles, [place]  [facility] [employer] [phone]

## 2019-02-19 NOTE — Lactation Note (Signed)
This note was copied from a baby's chart. Lactation Consultation Note  Patient Name: Stacie Eaton OVZCH'Y Date: 02/19/2019 Reason for consult: Follow-up assessment;Term  1442 - 1520 - I followed up with [name]. She reports that breast feeding is still very painful. She tried to use a NS with a previous feeding but she states that baby became frustrated with the shield and she was unsure if baby was getting anything at the breast.  I assisted her with latching baby on her left breast. Her left nipple has a dark red compression stripe and a blister/bleeding area towards the top portion. I first attempted to latch baby in football hold, but baby had difficultly relaxing in this hold. We moved her to cross cradle hold. It took a few attempts for baby to latch with wide gape, chin-led to breast. Mom was very uncomfortable for the duration of the feeding (6/10 pain score). I noted rhythmic suckling sequences, but baby remained very tense (gripped hand/tight arm and body).   After about 6 minutes on the breast, the baby released. She quickly began to root again. I noted that [name]'s nipple appeared fairly round upon release. We attempted to re-latch baby, but it was too painful for mom. She asked that I help her hand express to feed some EBM to baby. I hand expressed for about 5-10 minutes and gave baby about 6 mls of her EBM which baby took by spoon.  [name] is cueing and exhibiting cluster feeding signals. [name] has a hand pump at the bedside. I discussed the use of a DEBP if she is unable to latch baby to the breast due to pain. I also encouraged hand expression and spoon feeding.   [name] verbalized understanding. I encouraged her to call for follow up this evening. [name] might benefit from a follow up OP appointment.   Maternal Data Formula Feeding for Exclusion: No Has patient been taught Hand Expression?: Yes Does the patient have breastfeeding experience prior to this  delivery?: Yes  Feeding Feeding Type: Breast Fed  LATCH Score Latch: Grasps breast easily, tongue down, lips flanged, rhythmical sucking.  Audible Swallowing: A few with stimulation  Type of Nipple: Everted at rest and after stimulation  Comfort (Breast/Nipple): Engorged, cracked, bleeding, large blisters, severe discomfort  Hold (Positioning): Assistance needed to correctly position infant at breast and maintain latch.  LATCH Score: 6  Interventions Interventions: Breast feeding basics reviewed;Assisted with latch;Skin to skin;Hand express;Breast compression;Adjust position;Support pillows;Position options;Expressed milk  Lactation Tools Discussed/Used     Consult Status Consult Status: Follow-up Date: 02/20/19 Follow-up type: In-patient    Lenore Manner 02/19/2019, 3:52 PM

## 2019-02-19 NOTE — Lactation Note (Signed)
This note was copied from a baby's chart. Lactation Consultation Note Baby is 5 hrs old. C/o painful latches. Mom has bruising bilaterally to nipples. Breast tender to touch. Hand expression w/easy flow of colostrum. Painful to hand express d/t tender breast. When LC arrived baby BF to Rt. Breast, cheeks weren't touching breast. Adjusted head not to pull on breast.  Mom stated she pumped and bottle fed her daughter 23 yrs old. She couldn't tolerate latching. Mom stated her 1st child had a tight lip. This baby has has a labial frenulum as well. In her upper palate there is a sharpe ridge coming from gum line to back of upper palate. Baby has posterior frenulum. Chomps, holds w/tight grip.  Fitted mom w/#24 NS. Latched baby in football hold. Baby wouldn't suckle but a few times. Baby fell asleep at the breast. Much stimulation attempted. Suggested setting up DEBP. Mom refused stating she doesn't like DEBP, only hand pump. Mom stated she would pump 6 oz. out of each breast. Comfort gels given to mom. Stated they feel good. Hand express 50ml colostrum and spoon fed baby.  Mom's breast very tender to touch. Newborn behavior, STS, I&O, cluster feeding, supply and demand, positioning, and support discussed. Lactation brochure given. Encouraged to call for assistance or plan.    Patient Name: Stacie Eaton [id number] Date: 02/19/2019 Reason for consult: Initial assessment;Term   Maternal Data Has patient been taught Hand Expression?: Yes Does the patient have breastfeeding experience prior to this delivery?: Yes  Feeding Feeding Type: Breast Milk  LATCH Score Latch: Repeated attempts needed to sustain latch, nipple held in mouth throughout feeding, stimulation needed to elicit sucking reflex.  Audible Swallowing: A few with stimulation  Type of Nipple: Everted at rest and after stimulation(short shaft)  Comfort (Breast/Nipple): Filling, red/small blisters or bruises, mild/mod  discomfort(bruised/tender breast)  Hold (Positioning): Assistance needed to correctly position infant at breast and maintain latch.  LATCH Score: 6  Interventions Interventions: Breast feeding basics reviewed;Adjust position;Assisted with latch;Support pillows;Skin to skin;Position options;Breast massage;Expressed milk;Hand express;Breast compression;Hand pump;Comfort gels  Lactation Tools Discussed/Used Tools: Pump;Comfort gels;Nipple Shields Nipple shield size: 24 Breast pump type: Manual Pump Review: Milk Storage(mom wanting to rest) Initiated by:: L> Abbey Chatters RN IBCLC Date initiated:: 02/19/19   Consult Status Consult Status: Follow-up Date: 02/19/19 Follow-up type: In-patient    Theodoro Kalata 02/19/2019, 4:13 AM

## 2019-02-19 NOTE — Progress Notes (Signed)
Post Partum Day 1: SVD 02/18/2019 at 2239 Subjective: no complaints, up ad lib, voiding, tolerating PO and + flatus  Objective: Blood pressure 117/65, pulse 67, temperature (!) 97.5 F (36.4 C), temperature source Oral, resp. rate 18, height 5\' 1"  (1.549 m), weight 62.1 kg, last menstrual period 04/27/2018, SpO2 (!) 87 %.  Physical Exam:  General: alert, cooperative and no distress Lochia: appropriate Uterine Fundus: firm Incision: N/A DVT Evaluation: No evidence of DVT seen on physical exam.  Recent Labs    02/18/19 1822  HGB 11.6*  HCT 37.9    Assessment/Plan: Plan for discharge tomorrow and Breastfeeding   LOS: 1 day   Darlina Rumpf, CNM 02/19/2019, 7:24 AM

## 2019-02-20 DIAGNOSIS — D696 Thrombocytopenia, unspecified: Secondary | ICD-10-CM

## 2019-02-20 MED ORDER — IBUPROFEN 600 MG PO TABS: 600.0000 mg | ORAL_TABLET | Freq: Four times a day (QID) | ORAL | 0 refills | Status: DC

## 2019-02-20 MED ORDER — SENNOSIDES-DOCUSATE SODIUM 8.6-50 MG PO TABS: 2.0000 | ORAL_TABLET | ORAL | 0 refills | Status: DC

## 2019-02-20 NOTE — Lactation Note (Addendum)
This note was copied from a baby's chart. Lactation Consultation Note  Patient Name: Stacie Eaton [id number] Date: 02/20/2019    Mom noted an improvement in going from the Similac slow-flow to Enfamil Extra-Slow Flow nipple. Mom reports that infant would pull back & cough with the Similac yellow slow-flow nipple.   With the Enfamil Extra-Slow Flow nipple, I noted that infant swallows were rapid (pacing was done), but only small volumes were consumed. Infant seemed to want to pull back slightly at times. Posterior tongue restriction possibly noted when infant cries.  I called Leretha Dykes, SLP to come for an assessment. Dr. Karlene Einstein was made aware. Stormy Card, RN was given an update.  Matthias Hughs [facility] 02/20/2019, 11:18 AM

## 2019-02-20 NOTE — Discharge Instructions (Signed)

## 2019-02-20 NOTE — Lactation Note (Signed)
This note was copied from a baby's chart. Lactation Consultation Note  Patient Name: Girl Lakara Weiland [id number] Date: 02/20/2019 Reason for consult: Mother's request;Nipple pain/trauma  Mom had been provided a size 27 & a size 30 flange for the hand pump. However, her nipple suggests that she needs a size 24 flange, which was provided. I suggested she use coconut oil (which is at the bedside) to lightly lubricate flange to increase comfort (Mom's nipples have compression stripes & she needed a break from nursing overnight).   Mom stated concern about infant's bottle feeding & that the slow-flow nipple provided flows too fast. She said that for the 1st feeding, it took 1 hr for the infant to drink 10 mL. For the 2nd bottle feeding, Mom said infant fell asleep. I provided an Enfamil Extra-Slow Flow nipple & asked Mom to call me to observe a feeding.   Matthias Hughs [facility] 02/20/2019, 10:01 AM

## 2019-02-20 NOTE — Lactation Note (Signed)
This note was copied from a baby's chart. Lactation Consultation Note  Patient Name: Stacie Eaton [id number] Date: 02/20/2019    Mom's feeding plan/goals for discharge home: She will offer breast & if bruising occurs, she will pump & BO. Mom had an abundant supply with her 1st child, so will likely not need to use much formula.  Dr. Karlene Einstein was made aware of the above.   Mom already has Comfort Gels for home use.   Matthias Hughs [facility] 02/20/2019, 12:22 PM

## 2019-02-20 NOTE — Lactation Note (Signed)
This note was copied from a baby's chart. Lactation Consultation Note Mom requested [place].  Mom asked LC for formula d/t needing to rest her nipple. Nipples w/bruising. Mom has NS doesn't like it. Offered to try a different size. Mom refused.  Moms breast starting to fill. Knots noted. Mom states they became that way when she became pregnant. Hand expressed colostrum. Encouraged mom to han express colostrum. Mom states tender. Encouraged mom to pump. Mom doesn't like DEBP will only use hand pump.  Explained to mom if she uses formula her breast breast are going to be filling w/colostrum, increased knots and make it harder to get the milk out. Mom states she isn't able to get anything when she uses hand pump or hand express.  Demonstrated hand expression and hand pumping. Mom stated she didn't feel like she was doing it right because it was hurting so she would stop. Reviewed engorgement, management, ICE, reverse pressure, pumping, hand expression, and milk storage.  Applied coconut oil before using pump. reminded not to use coconut oil and comfort gels together. Mom stated she remembered. Mom asked for formula to give her breast a break. Similac formula given w/supplemental feeding sheet. And slow flow nipple. LEAD discussed.  Patient Name: Stacie Eaton [id number] Date: 02/20/2019 Reason for consult: Mother's request;Nipple pain/trauma   Maternal Data    Feeding Feeding Type: Breast Fed  LATCH Score          Comfort (Breast/Nipple): Engorged, cracked, bleeding, large blisters, severe discomfort(bruised w/moderate pain)        Interventions Interventions: Hand express;Breast massage;Hand pump;Coconut oil  Lactation Tools Discussed/Used Breast pump type: Manual   Consult Status Consult Status: Follow-up Date: 02/20/19 Follow-up type: In-patient    [name], Elta Guadeloupe 02/20/2019, 6:09 AM

## 2019-02-21 ENCOUNTER — Encounter (HOSPITAL_COMMUNITY): Payer: Self-pay | Admitting: *Deleted

## 2019-02-21 ENCOUNTER — Inpatient Hospital Stay (HOSPITAL_COMMUNITY): Payer: Medicaid Other

## 2019-05-01 ENCOUNTER — Other Ambulatory Visit: Payer: Self-pay

## 2019-05-01 ENCOUNTER — Encounter (HOSPITAL_COMMUNITY): Payer: Self-pay

## 2019-05-01 DIAGNOSIS — R102 Pelvic and perineal pain: Secondary | ICD-10-CM | POA: Diagnosis present

## 2019-05-01 DIAGNOSIS — R8271 Bacteriuria: Secondary | ICD-10-CM | POA: Insufficient documentation

## 2019-05-01 DIAGNOSIS — Z793 Long term (current) use of hormonal contraceptives: Secondary | ICD-10-CM | POA: Insufficient documentation

## 2019-05-01 DIAGNOSIS — K5901 Slow transit constipation: Secondary | ICD-10-CM | POA: Diagnosis not present

## 2019-05-01 DIAGNOSIS — K5900 Constipation, unspecified: Secondary | ICD-10-CM | POA: Diagnosis not present

## 2019-05-01 MED ORDER — SODIUM CHLORIDE 0.9% FLUSH: 3.0000 mL | Freq: Once | INTRAVENOUS | Status: DC

## 2019-05-01 NOTE — ED Triage Notes (Signed)
Pt c/o abdominal pain. She reports LBM was 2.5 weeks ago. Denies vomiting, but endorses nausea.

## 2019-05-02 ENCOUNTER — Emergency Department (HOSPITAL_COMMUNITY): Payer: Medicaid Other

## 2019-05-02 ENCOUNTER — Emergency Department (HOSPITAL_COMMUNITY)
Admission: EM | Admit: 2019-05-02 | Discharge: 2019-05-02 | Disposition: A | Discharge status: Home / Self Care | Payer: Medicaid Other | Attending: Emergency Medicine | Admitting: Emergency Medicine

## 2019-05-02 DIAGNOSIS — K5900 Constipation, unspecified: Secondary | ICD-10-CM | POA: Diagnosis not present

## 2019-05-02 DIAGNOSIS — R8271 Bacteriuria: Secondary | ICD-10-CM

## 2019-05-02 DIAGNOSIS — K5901 Slow transit constipation: Secondary | ICD-10-CM

## 2019-05-02 LAB — COMPREHENSIVE METABOLIC PANEL
ALT: 49 U/L — ABNORMAL HIGH (ref 0–44)
AST: 24 U/L (ref 15–41)
Albumin: 4.2 g/dL (ref 3.5–5.0)
Alkaline Phosphatase: 60 U/L (ref 38–126)
Anion gap: 8 (ref 5–15)
BUN: 13 mg/dL (ref 6–20)
CO2: 25 mmol/L (ref 22–32)
Calcium: 9.3 mg/dL (ref 8.9–10.3)
Chloride: 105 mmol/L (ref 98–111)
Creatinine, Ser: 0.53 mg/dL (ref 0.44–1.00)
GFR calc Af Amer: >60 mL/min (ref >60)
GFR calc non Af Amer: >60 mL/min (ref >60)
Glucose, Bld: 86 mg/dL (ref 70–99)
Potassium: 3.7 mmol/L (ref 3.5–5.1)
Sodium: 138 mmol/L (ref 135–145)
Total Bilirubin: 0.4 mg/dL (ref 0.3–1.2)
Total Protein: 7.5 g/dL (ref 6.5–8.1)

## 2019-05-02 LAB — URINALYSIS, ROUTINE W REFLEX MICROSCOPIC
Bilirubin Urine: NEGATIVE
Glucose, UA: NEGATIVE mg/dL
Ketones, ur: NEGATIVE mg/dL
Nitrite: NEGATIVE
Protein, ur: NEGATIVE mg/dL
Specific Gravity, Urine: 1.024 (ref 1.005–1.030)
pH: 6 (ref 5.0–8.0)

## 2019-05-02 LAB — CBC
HCT: 40.5 % (ref 36.0–46.0)
Hemoglobin: 12.8 g/dL (ref 12.0–15.0)
MCH: 27.1 pg (ref 26.0–34.0)
MCHC: 31.6 g/dL (ref 30.0–36.0)
MCV: 85.8 fL (ref 80.0–100.0)
Platelets: 172 10*3/uL (ref 150–400)
RBC: 4.72 MIL/uL (ref 3.87–5.11)
RDW: 18.7 % — ABNORMAL HIGH (ref 11.5–15.5)
WBC: 5.3 10*3/uL (ref 4.0–10.5)
nRBC: 0 % (ref 0.0–0.2)

## 2019-05-02 LAB — I-STAT BETA HCG BLOOD, ED (MC, WL, AP ONLY): I-stat hCG, quantitative: <5 m[IU]/mL (ref <5)

## 2019-05-02 LAB — LIPASE, BLOOD: Lipase: 34 U/L (ref 11–51)

## 2019-05-02 MED ORDER — BISACODYL 10 MG RE SUPP
10.0000 mg | Freq: Once | RECTAL | Status: AC
  Administered 2019-05-02: 10 mg via RECTAL
  Filled 2019-05-02: qty 1

## 2019-05-02 NOTE — Discharge Instructions (Signed)
You were seen in the ER for constipation.  Exam did not reveal impaction of stool  Xray showed a large amount of stool reflecting uncomplicated constipation, but no obstruction  Urine sample was contaminated and not a good sample, however you had a lot of white blood cells and bacteria in your urine. This test is inconclusive. You have no urinary tract infection symptoms, so we will defer antibiotic today but will send your urine to culture (to confirm if there is a urine infection or not).  If culture comes back positive you will be contacted and antibiotics will be prescribed.  If you do not hear from [place], you can assume your culture was negative and you did not have a UTI.  We will treat your constipation with a bowel regimen for the next 24- hours to induce a bowel movement.    BOWEL REGIMEN/CLEAN OUT Ingredients Gatorade, propel, crystal light or other non carbonated clear liquid drink or water Dulcolax (bisacodyl) laxative tablets  Miralax powder (Polyethylene glycol)  Steps  In a pitcher mix the 8.3 ounces of MiraLAX with the 64 ounces of Gatorade (or other liquid). Stir/shake the contents until the entire contents of MiraLAX are completely dissolved. Day 1: Take 3 tablets of dulxolax laxative pill.  Start drinking the first half of the Miralax and Gatorade solution.  Drink 8 ounce glass every 10-20 mins until you have finished half of the solution on day 1 Day 2: Finish remaining Miralax and Gatorade solution.   Increase water intake as much as you can. Increase activity, walking, exercise to wake your gut up and induce bowel movement.    Return to ER for fever, nausea, vomiting, rectal pain or pressure, blood in stool, abdominal pain. Return to ER for burning with urination, blood in urine, urinary frequency or urgency, flank pain

## 2019-05-02 NOTE — ED Provider Notes (Signed)
[facility] DEPT Provider Note   CSN: [id number] Arrival date & time: 05/01/19  2100     History   Chief Complaint Chief Complaint  Patient presents with  . Abdominal Pain    HPI Stacie Eaton is a 23 y.o. female presents today for evaluation of constipation.  States she has not had a bowel movement in the last 2.5 weeks.  Has associated mild nausea and mild suprapubic abdominal pain.  None currently.  She had a an uncomplicated vaginal delivery 2 months ago.  She has not started a new birth control medicine.  She used to be on iron during pregnancy but stopped this.  She denies any other medication changes.  She does not take any pain medicines.  Has never had any issues with constipation in the past.  Admits that since having a newborn she has been eating and drinking less just because she is so busy.  She denies any previous abdominal surgeries.  She denies any associated fever, vomiting, dysuria, hematuria, flank pain.  States she has the urge to have a bowel movement but this is rare, when she tries to strain nothing will come out.  Denies any leaking of stool.  She denies any rectal pain or pressure.  Over the last 4 days she has taken a capful of laxative once a day but this has not helped.     HPI  Past Medical History:  Diagnosis Date  . Depression    postpartum  . Urticaria     Patient Active Problem List   Diagnosis Date Noted  . SVD (spontaneous vaginal delivery) 02/20/2019  . Gestational thrombocytopenia ([place]) 02/20/2019  . Oligohydramnios 02/18/2019  . Chronic urticaria 07/13/2016    Past Surgical History:  Procedure Laterality Date  . NO PAST SURGERIES       OB History    Gravida  2   Para  2   Term  2   Preterm      AB      Living  2     SAB      TAB      Ectopic      Multiple  0   Live Births  1            Home Medications    Prior to Admission medications   Medication Sig Start Date End Date  Taking? Authorizing Provider  norethindrone-ethinyl estradiol (LOESTRIN FE) 1-20 MG-MCG tablet Take 1 tablet by mouth daily.   Yes [provider]  ibuprofen (ADVIL) 600 MG tablet Take 1 tablet (600 mg total) by mouth every 6 (six) hours. Patient not taking: Reported on 05/02/2019 02/20/19   Nicolette Bang, DO  senna-docusate (SENOKOT-S) 8.6-50 MG tablet Take 2 tablets by mouth daily. Patient not taking: Reported on 05/02/2019 02/21/19   Nicolette Bang, DO    Family History Family History  Problem Relation Age of Onset  . Allergic rhinitis Neg Hx   . Angioedema Neg Hx   . Asthma Neg Hx   . Eczema Neg Hx   . Immunodeficiency Neg Hx   . Urticaria Neg Hx     Social History Social History   Tobacco Use  . Smoking status: Never Smoker  . Smokeless tobacco: Never Used  Substance Use Topics  . Alcohol use: Not Currently    Comment: rarely  . Drug use: Never     Allergies   Patient has no known allergies.   Review of Systems Review  of Systems  Gastrointestinal: Positive for abdominal pain, constipation and nausea.  All other systems reviewed and are negative.    Physical Exam Updated Vital Signs BP 104/68   Pulse (!) 58   Temp 98.2 F (36.8 C) (Oral)   Resp 17   LMP 04/27/2018   SpO2 99%   Physical Exam Vitals signs and nursing note reviewed.  Constitutional:      Appearance: She is well-developed.     Comments: Non toxic in NAD  HENT:     Head: Normocephalic and atraumatic.     Nose: Nose normal.  Eyes:     Conjunctiva/sclera: Conjunctivae normal.  Neck:     Musculoskeletal: Normal range of motion.  Cardiovascular:     Rate and Rhythm: Normal rate and regular rhythm.  Pulmonary:     Effort: Pulmonary effort is normal.     Breath sounds: Normal breath sounds.  Abdominal:     General: Bowel sounds are normal.     Palpations: Abdomen is soft.     Tenderness: There is no abdominal tenderness.     Comments: No G/R/R. No  suprapubic or CVA tenderness. Negative Murphy's and McBurney's. Active BS to lower quadrants.   Genitourinary:    Comments:  Chaperone was present.  There are no external fissures or external hemorrhoids noted. No induration or swelling of the perianal skin.  Very soft stool in rectal vault, stool color is brown with no gross blood noted.  No signs of perirectal abscess.  DRE reveals good sphincter tone.    Musculoskeletal: Normal range of motion.  Skin:    General: Skin is warm and dry.     Capillary Refill: Capillary refill takes less than 2 seconds.  Neurological:     Mental Status: She is alert.  Psychiatric:        Behavior: Behavior normal.      ED Treatments / Results  Labs (all labs ordered are listed, but only abnormal results are displayed) Labs Reviewed  COMPREHENSIVE METABOLIC PANEL - Abnormal; Notable for the following components:      Result Value   ALT 49 (*)    All other components within normal limits  CBC - Abnormal; Notable for the following components:   RDW 18.7 (*)    All other components within normal limits  URINALYSIS, ROUTINE W REFLEX MICROSCOPIC - Abnormal; Notable for the following components:   APPearance HAZY (*)    Hgb urine dipstick MODERATE (*)    Leukocytes,Ua LARGE (*)    Bacteria, UA FEW (*)    All other components within normal limits  URINE CULTURE  LIPASE, BLOOD  I-STAT BETA HCG BLOOD, ED ([facility], [facility], [facility] ONLY)    EKG None  Radiology Dg Abdomen 1 View  Result Date: 05/02/2019 CLINICAL DATA:  23 year old female with constipation. EXAM: ABDOMEN - 1 VIEW COMPARISON:  None. FINDINGS: Large amount of stool noted throughout the colon. No bowel dilatation or evidence of obstruction. No free air or radiopaque calculi. The osseous structures and soft tissues are grossly unremarkable. IMPRESSION: Constipation. No bowel obstruction. Electronically Signed   By: Elgie CollardArash  Radparvar M.D.   On: 05/02/2019 03:10    Procedures Procedures (including  critical care time)  Medications Ordered in ED Medications  sodium chloride flush (NS) 0.9 % injection 3 mL (0 mLs Intravenous Hold 05/02/19 0302)  bisacodyl (DULCOLAX) suppository 10 mg (10 mg Rectal Given 05/02/19 0412)     Initial Impression / Assessment and Plan / ED Course  I  have reviewed the triage vital signs and the nursing notes.  Pertinent labs & imaging results that were available during my care of the patient were reviewed by me and considered in my medical decision making (see chart for details).  Clinical Course as of May 01 426  Thu May 02, 2019  0423 Leukocytes,Ua(!): LARGE [CG]  0423 RBC / HPF: 0-5 [CG]  0423 WBC, UA: 21-50 [CG]  0423 Bacteria, UA(!): FEW [CG]  0423 Squamous Epithelial / LPF: 6-10 [CG]  0423 Mucus: PRESENT [CG]    Clinical Course User Index [CG] Liberty Handy, PA-C   23 year old is here for constipation.  Has intermittent nausea and lower abdominal pain but not currently.  Symptoms refractory to 1 capful of MiraLAX daily for the last 4 days.  Exam is very reassuring.  Afebrile.  She has no abdominal tenderness.  Given her report of no BM in the last 2.5 weeks, I offered a rectal exam to check for impaction.  DRE shows very soft stool in the rectal vault but no obvious impaction.  ER work-up initiated in triage, reviewed by me is essentially normal.  Electrolytes WNL.  Given her intermittent suprapubic abdominal pain I obtained a urinalysis that showed large leukocytes, 21-50 WBC, few bacteria but sample was contaminated.  Patient does not have any urinary tract infection symptoms, CVA tenderness or suprapubic tenderness on exam.  We will send a urine culture and defer antibiotics given her lack of symptoms.  2.5 weeks of no bowel movement is impressive however patient has a very benign exam.  She has no history of abdominal surgeries.  Does not take any pain medicines.  Stopped iron pills after she delivered child 2 months ago.  I considered SBO,  ileus however these are unlikely given her benign exam and symptoms.  No signs of fecal impaction on exam.  KUB reviewed by me and radiologist shows large amount of stool in the colon but no obstructive pattern.  Given benign exam, work-up today, we will opt for conservative approach to treat constipation.  I do not think there is indication for CT imaging here today given her benign exam and lack of symptoms.  Have given patient instructions on how to use MiraLAX and Dulcolax cleanout over the next 48 hours.  Encouraged oral hydration.  Return precautions discussed.  Patient is comfortable with this.  Final Clinical Impressions(s) / ED Diagnoses   Final diagnoses:  Slow transit constipation  Asymptomatic bacteriuria    ED Discharge Orders    None       Liberty Handy, PA-C 05/02/19 0430    Devoria Albe, MD 05/02/19 [phone]

## 2019-05-02 NOTE — ED Notes (Signed)
Pt ambulated to bathroom with steady gait, without assistance.

## 2019-05-02 NOTE — ED Notes (Signed)
Pt asked to provide a urine sample. Pt said she does not feel like she will be able to, but was said she would attempt to. Pt provided a specimen cup.

## 2019-05-02 NOTE — ED Notes (Signed)
Pt is aware of the need for a urine sample. States she cannot go right now, however, will let staff know when she needs to urinate.

## 2019-05-04 LAB — URINE CULTURE: Culture: 60000 — AB

## 2019-05-05 NOTE — Progress Notes (Signed)
ED Antimicrobial Stewardship Positive Culture Follow Up   Stacie Eaton is an 23 y.o. female who presented to [facility] on 05/02/2019 for evaluation of constipation. Associated mild nausea and suprapubic abdominal pain. Uncomplicated vaginal delivery 2 months ago. No fever, dysuria, hematuria, or flank pain. WBC WNL. Per ED provider note: Patient does not have UTI symptoms. Will defer antibiotics given her lack of symptoms.    Recent Results (from the past 720 hour(s))  Urine culture     Status: Abnormal   Collection Time: 05/02/19  3:52 AM   Specimen: Urine, Clean Catch  Result Value Ref Range Status   Specimen Description   Final    URINE, CLEAN CATCH Performed at [facility], [place] [address] [place], [place] [address]    Special Requests   Final    NONE Performed at [facility], [place] [address] [place], [place] [address]    Culture (A)  Final    60,000 COLONIES/mL LACTOBACILLUS SPECIES Standardized susceptibility testing for this organism is not available. Performed at [facility], [place] [address] [place], [place] [address]    Report Status 05/04/2019 FINAL  Final    Plan: No antibiotics needed  ED Provider: Dr. Blanchie Dessert   Luiz Ochoa 05/05/2019, 5:58 PM Clinical Pharmacist [phone]

## 2019-05-06 ENCOUNTER — Telehealth: Payer: Self-pay | Admitting: *Deleted

## 2019-05-06 NOTE — Telephone Encounter (Signed)
Post ED Visit - Positive Culture Follow-up  Culture report reviewed by antimicrobial stewardship pharmacist: [facility] Team []  Elenor Quinones, Pharm.D. []  Heide Guile, Pharm.D., BCPS AQ-ID []  Parks Neptune, Pharm.D., BCPS []  Alycia Rossetti, Pharm.D., BCPS []  [place], [place].D., BCPS, AAHIVP []  Legrand Como, Pharm.D., BCPS, AAHIVP []  Salome Arnt, PharmD, BCPS []  Johnnette Gourd, PharmD, BCPS []  Hughes Better, PharmD, BCPS []  Leeroy Cha, PharmD []  Laqueta Linden, PharmD, BCPS []  Albertina Parr, PharmD  [place] Team []  Leodis Sias, PharmD []  Lindell Spar, PharmD []  Royetta Asal, PharmD []  Graylin Shiver, Rph []  Rema Fendt) Glennon Mac, PharmD []  Arlyn Dunning, PharmD []  Netta Cedars, PharmD []  Dia Sitter, PharmD []  Leone Haven, PharmD []  Gretta Arab, PharmD []  Theodis Shove, PharmD []  Peggyann Juba, PharmD []  Reuel Boom, PharmD   Positive urine culture, reviewed by Blanchie Dessert, PA No UTI symptoms and no further patient follow-up is required at this time.  Harlon Flor [facility] 05/06/2019, 4:29 PM

## 2019-06-15 ENCOUNTER — Encounter (HOSPITAL_COMMUNITY): Payer: Self-pay | Admitting: Emergency Medicine

## 2019-06-15 ENCOUNTER — Emergency Department (HOSPITAL_COMMUNITY): Payer: Managed Care, Other (non HMO)

## 2019-06-15 ENCOUNTER — Other Ambulatory Visit: Payer: Self-pay

## 2019-06-15 ENCOUNTER — Emergency Department (HOSPITAL_COMMUNITY)
Admission: EM | Admit: 2019-06-15 | Discharge: 2019-06-15 | Disposition: A | Discharge status: Home / Self Care | Payer: Managed Care, Other (non HMO) | Attending: Emergency Medicine | Admitting: Emergency Medicine

## 2019-06-15 DIAGNOSIS — R42 Dizziness and giddiness: Secondary | ICD-10-CM | POA: Insufficient documentation

## 2019-06-15 DIAGNOSIS — H538 Other visual disturbances: Secondary | ICD-10-CM | POA: Diagnosis not present

## 2019-06-15 DIAGNOSIS — H5710 Ocular pain, unspecified eye: Secondary | ICD-10-CM | POA: Diagnosis present

## 2019-06-15 DIAGNOSIS — H5702 Anisocoria: Secondary | ICD-10-CM | POA: Diagnosis not present

## 2019-06-15 DIAGNOSIS — R519 Headache, unspecified: Secondary | ICD-10-CM | POA: Diagnosis not present

## 2019-06-15 DIAGNOSIS — Z79899 Other long term (current) drug therapy: Secondary | ICD-10-CM | POA: Diagnosis not present

## 2019-06-15 LAB — CBC WITH DIFFERENTIAL/PLATELET
Abs Immature Granulocytes: 0.01 10*3/uL (ref 0.00–0.07)
Basophils Absolute: 0 10*3/uL (ref 0.0–0.1)
Basophils Relative: 1 %
Eosinophils Absolute: 0.1 10*3/uL (ref 0.0–0.5)
Eosinophils Relative: 2 %
HCT: 46.1 % — ABNORMAL HIGH (ref 36.0–46.0)
Hemoglobin: 14.9 g/dL (ref 12.0–15.0)
Immature Granulocytes: 0 %
Lymphocytes Relative: 43 %
Lymphs Abs: 1.9 10*3/uL (ref 0.7–4.0)
MCH: 29.1 pg (ref 26.0–34.0)
MCHC: 32.3 g/dL (ref 30.0–36.0)
MCV: 90 fL (ref 80.0–100.0)
Monocytes Absolute: 0.3 10*3/uL (ref 0.1–1.0)
Monocytes Relative: 6 %
Neutro Abs: 2.1 10*3/uL (ref 1.7–7.7)
Neutrophils Relative %: 48 %
Platelets: 153 10*3/uL (ref 150–400)
RBC: 5.12 MIL/uL — ABNORMAL HIGH (ref 3.87–5.11)
RDW: 12.4 % (ref 11.5–15.5)
WBC: 4.3 10*3/uL (ref 4.0–10.5)
nRBC: 0 % (ref 0.0–0.2)

## 2019-06-15 LAB — BASIC METABOLIC PANEL
Anion gap: 8 (ref 5–15)
BUN: 14 mg/dL (ref 6–20)
CO2: 28 mmol/L (ref 22–32)
Calcium: 9.7 mg/dL (ref 8.9–10.3)
Chloride: 102 mmol/L (ref 98–111)
Creatinine, Ser: 0.56 mg/dL (ref 0.44–1.00)
GFR calc Af Amer: >60 mL/min (ref >60)
GFR calc non Af Amer: >60 mL/min (ref >60)
Glucose, Bld: 78 mg/dL (ref 70–99)
Potassium: 4.2 mmol/L (ref 3.5–5.1)
Sodium: 138 mmol/L (ref 135–145)

## 2019-06-15 LAB — I-STAT BETA HCG BLOOD, ED (MC, WL, AP ONLY): I-stat hCG, quantitative: <5 m[IU]/mL (ref <5)

## 2019-06-15 LAB — RAPID HIV SCREEN (HIV 1/2 AB+AG)
HIV 1/2 Antibodies: NONREACTIVE
HIV-1 P24 Antigen - HIV24: NONREACTIVE

## 2019-06-15 MED ORDER — IOHEXOL 350 MG/ML SOLN
80.0000 mL | Freq: Once | INTRAVENOUS | Status: AC | PRN
  Administered 2019-06-15: 13:00:00 via INTRAVENOUS

## 2019-06-15 MED ORDER — TETRACAINE HCL 0.5 % OP SOLN
1.0000 [drp] | Freq: Once | OPHTHALMIC | Status: AC
  Administered 2019-06-15: 1 [drp] via OPHTHALMIC
  Filled 2019-06-15: qty 4

## 2019-06-15 MED ORDER — SODIUM CHLORIDE (PF) 0.9 % IJ SOLN
INTRAMUSCULAR | Status: AC
  Filled 2019-06-15: qty 50

## 2019-06-15 MED ORDER — GADOBUTROL 1 MMOL/ML IV SOLN
5.0000 mL | Freq: Once | INTRAVENOUS | Status: AC | PRN
  Administered 2019-06-15: 5 mL via INTRAVENOUS

## 2019-06-15 NOTE — ED Triage Notes (Signed)
Per pt, states her right pupil has always been bigger than her left-states it is slightly larger than usual-states she has been seeing spots in that eye-states she feels some pressure in that eye-has not see an opthalmologist or PCP

## 2019-06-15 NOTE — ED Provider Notes (Signed)
[facility]  Refer to provider note for full history and physical examination.  Briefly patient is a 23 year old female presenting today for evaluation of a few days of intermittent dizziness, right eye pressure, photophobia, and anisocoria.  Normal neurologic examination.  CTA head and neck is within normal limits.  Neurology was consulted who recommended obtaining MRI of the brain with and without contrast to rule out acute intracranial process including MS flare, tumor, CVA that could potentially be causing her symptoms.  If this is reassuring that she will likely be stable for discharge home with outpatient neurology follow-up.   Physical Exam  BP 128/74 (BP Location: Right Arm)   Pulse 86   Temp 98.3 F (36.8 C) (Oral)   Resp 16   LMP 05/15/2019   SpO2 100%   Physical Exam Vitals signs and nursing note reviewed.  Constitutional:      General: She is not in acute distress.    Appearance: She is well-developed.     Comments: Resting comfortably in chair  HENT:     Head: Normocephalic and atraumatic.  Eyes:     General:        Right eye: No discharge.        Left eye: No discharge.     Extraocular Movements: Extraocular movements intact.     Conjunctiva/sclera: Conjunctivae normal.     Comments: Mild anisocoria  Neck:     Vascular: No JVD.     Trachea: No tracheal deviation.  Cardiovascular:     Rate and Rhythm: Normal rate.  Pulmonary:     Effort: Pulmonary effort is normal.  Abdominal:     General: There is no distension.  Skin:    General: Skin is warm and dry.     Findings: No erythema.  Neurological:     Mental Status: She is alert.  Psychiatric:        Behavior: Behavior normal.     ED Course/Procedures     Procedures  MDM  Ct Angio Head W Or Wo Contrast  Result Date: 06/15/2019 CLINICAL DATA:  Subarachnoid hemorrhage suspected, initial exam. Tonic pupil. Intermittent headaches. EXAM: CT ANGIOGRAPHY HEAD AND NECK TECHNIQUE:  Multidetector CT imaging of the head and neck was performed using the standard protocol during bolus administration of intravenous contrast. Multiplanar CT image reconstructions and MIPs were obtained to evaluate the vascular anatomy. Carotid stenosis measurements (when applicable) are obtained utilizing NASCET criteria, using the distal internal carotid diameter as the denominator. CONTRAST:  <See Chart> OMNIPAQUE IOHEXOL 350 MG/ML SOLN 80 mL OMNIPAQUE IOHEXOL 350 MG/ML SOLN COMPARISON:  No pertinent prior studies available for comparison. FINDINGS: CT HEAD FINDINGS No evidence of acute intracranial hemorrhage. No demarcated cortical infarction. No evidence of intracranial mass. No midline shift or extra-axial fluid collection. Cerebral volume is normal. Vascular: Reported separately Skull: No calvarial fracture. Sinuses: No significant paranasal sinus disease or mastoid effusion at the imaged levels. Orbits: Visualized orbits demonstrate no acute abnormality. Review of the MIP images confirms the above findings CTA NECK FINDINGS Aortic arch: Standard branching. No significant stenosis of the proximal major branch vessels of the neck. Right carotid system: CCA and ICA smooth and widely patent within the neck. Left carotid system: CCA and ICA smooth and widely patent within the neck. Vertebral arteries: Codominant. Vertebral arteries smooth and widely patent within the neck. Skeleton: No acute bony abnormality or suspicious osseous lesion. Other neck: No soft tissue neck mass or pathologically enlarged cervical chain  lymph nodes. Thyroid negative. Upper chest: No consolidation within the imaged lung apices. Review of the MIP images confirms the above findings CTA HEAD FINDINGS Anterior circulation: The intracranial internal carotid arteries patent without significant stenosis. The bilateral middle and anterior cerebral arteries are patent without significant proximal stenosis. Posterior circulation: The intracranial  vertebral arteries are patent without significant stenosis, as is the basilar artery. The bilateral posterior cerebral arteries are patent without significant proximal stenosis. Venous sinuses: Within limitations of contrast timing, no convincing thrombus. Anatomic variants: Posterior communicating arteries are present bilaterally. Review of the MIP images confirms the above findings IMPRESSION: CT head: No evidence of acute intracranial abnormality. CTA neck: Common carotid, internal carotid and vertebral arteries smooth and widely patent within the neck. No dissection or stenosis. CTA head: Normal CT angiography of the head. Electronically Signed   By: Jackey Loge DO   On: 06/15/2019 14:02   Ct Angio Neck W And/or Wo Contrast  Result Date: 06/15/2019 CLINICAL DATA:  Subarachnoid hemorrhage suspected, initial exam. Tonic pupil. Intermittent headaches. EXAM: CT ANGIOGRAPHY HEAD AND NECK TECHNIQUE: Multidetector CT imaging of the head and neck was performed using the standard protocol during bolus administration of intravenous contrast. Multiplanar CT image reconstructions and MIPs were obtained to evaluate the vascular anatomy. Carotid stenosis measurements (when applicable) are obtained utilizing NASCET criteria, using the distal internal carotid diameter as the denominator. CONTRAST:  <See Chart> OMNIPAQUE IOHEXOL 350 MG/ML SOLN 80 mL OMNIPAQUE IOHEXOL 350 MG/ML SOLN COMPARISON:  No pertinent prior studies available for comparison. FINDINGS: CT HEAD FINDINGS No evidence of acute intracranial hemorrhage. No demarcated cortical infarction. No evidence of intracranial mass. No midline shift or extra-axial fluid collection. Cerebral volume is normal. Vascular: Reported separately Skull: No calvarial fracture. Sinuses: No significant paranasal sinus disease or mastoid effusion at the imaged levels. Orbits: Visualized orbits demonstrate no acute abnormality. Review of the MIP images confirms the above findings CTA  NECK FINDINGS Aortic arch: Standard branching. No significant stenosis of the proximal major branch vessels of the neck. Right carotid system: CCA and ICA smooth and widely patent within the neck. Left carotid system: CCA and ICA smooth and widely patent within the neck. Vertebral arteries: Codominant. Vertebral arteries smooth and widely patent within the neck. Skeleton: No acute bony abnormality or suspicious osseous lesion. Other neck: No soft tissue neck mass or pathologically enlarged cervical chain lymph nodes. Thyroid negative. Upper chest: No consolidation within the imaged lung apices. Review of the MIP images confirms the above findings CTA HEAD FINDINGS Anterior circulation: The intracranial internal carotid arteries patent without significant stenosis. The bilateral middle and anterior cerebral arteries are patent without significant proximal stenosis. Posterior circulation: The intracranial vertebral arteries are patent without significant stenosis, as is the basilar artery. The bilateral posterior cerebral arteries are patent without significant proximal stenosis. Venous sinuses: Within limitations of contrast timing, no convincing thrombus. Anatomic variants: Posterior communicating arteries are present bilaterally. Review of the MIP images confirms the above findings IMPRESSION: CT head: No evidence of acute intracranial abnormality. CTA neck: Common carotid, internal carotid and vertebral arteries smooth and widely patent within the neck. No dissection or stenosis. CTA head: Normal CT angiography of the head. Electronically Signed   By: Jackey Loge DO   On: 06/15/2019 14:02   Mr Brain W And Wo Contrast  Result Date: 06/15/2019 CLINICAL DATA:  Tonic pupil. Additional history provided: 1 week of intermittent right eye pressure, intermittent dizziness and mental fogginess, blurry vision/seeing spots, photophobia, tingling  in legs earlier this week. EXAM: MRI HEAD WITHOUT AND WITH CONTRAST  TECHNIQUE: Multiplanar, multiecho pulse sequences of the brain and surrounding structures were obtained without and with intravenous contrast. CONTRAST:  39mL GADAVIST GADOBUTROL 1 MMOL/ML IV SOLN COMPARISON:  CT angiogram head/neck earlier the same day 06/15/2019 FINDINGS: Brain: There is no evidence of acute infarct. No evidence of intracranial mass. No midline shift or extra-axial fluid collection. No chronic intracranial blood products. Incidentally noted 6 mm right choroid fissure cyst, benign (series 12, image 12). No other focal parenchymal signal abnormality. Mild asymmetry of the lateral ventricles, likely developmental. No abnormal intracranial enhancement is identified. Vascular: Flow voids maintained within the proximal large arterial vessels. Expected enhancement within the dural venous sinuses. Skull and upper cervical spine: No focal marrow lesion Sinuses/Orbits: Visualized orbits demonstrate no acute abnormality. No significant paranasal sinus disease or mastoid effusion. IMPRESSION: 1. No evidence of acute intracranial abnormality. 2. 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 Electronically Signed   By: Kellie Simmering DO   On: 06/15/2019 17:34   Imaging shows no evidence of acute intracranial abnormality.  Patient resting comfortably on reevaluation.  She feels comfortable with discharge home.  Recommend outpatient neurology follow-up.  Discussed strict ED return precautions. Patient verbalized understanding of and agreement with plan and is safe for discharge home at this time.        Renita Papa, PA-C 06/15/19 1810    Gareth Morgan, MD 06/18/19 1157

## 2019-06-15 NOTE — ED Provider Notes (Signed)
Stacie Eaton Provider Note   CSN: [id number] Arrival date & time: 06/15/19  3825   History   Chief Complaint Chief Complaint  Patient presents with  . Eye Pain    HPI Stacie Eaton is a 23 y.o. female who presents with R eye pressure. She states that for about one week she has had intermittent R eye pressure. She wasn't sure what was wrong with her eye and she looked in the mirror and noted that her R pupil was bigger than her left. She reports associated intermittent dizziness, "mental fogginess", blurry vision/seeing spots. She also has been having photophobia. Symptoms are better at night when it gets dark. She denies headache, severe dizziness or eye pain, unilateral weakness. She had some tingling in her legs earlier in the weak which has resolved. She denies any new meds. She denies head or facial trauma. She doesn't wear contacts or glasses. She is a stay at home mom.    HPI  Past Medical History:  Diagnosis Date  . Depression    postpartum  . Urticaria     Patient Active Problem List   Diagnosis Date Noted  . SVD (spontaneous vaginal delivery) 02/20/2019  . Gestational thrombocytopenia ([place]) 02/20/2019  . Oligohydramnios 02/18/2019  . Chronic urticaria 07/13/2016    Past Surgical History:  Procedure Laterality Date  . NO PAST SURGERIES       OB History    Gravida  2   Para  2   Term  2   Preterm      AB      Living  2     SAB      TAB      Ectopic      Multiple  0   Live Births  1            Home Medications    Prior to Admission medications   Medication Sig Start Date End Date Taking? Authorizing Provider  ibuprofen (ADVIL) 600 MG tablet Take 1 tablet (600 mg total) by mouth every 6 (six) hours. Patient not taking: Reported on 05/02/2019 02/20/19   Nicolette Bang, DO  norethindrone-ethinyl estradiol (LOESTRIN FE) 1-20 MG-MCG tablet Take 1 tablet by mouth daily.    [provider]   senna-docusate (SENOKOT-S) 8.6-50 MG tablet Take 2 tablets by mouth daily. Patient not taking: Reported on 05/02/2019 02/21/19   Nicolette Bang, DO    Family History Family History  Problem Relation Age of Onset  . Allergic rhinitis Neg Hx   . Angioedema Neg Hx   . Asthma Neg Hx   . Eczema Neg Hx   . Immunodeficiency Neg Hx   . Urticaria Neg Hx     Social History Social History   Tobacco Use  . Smoking status: Never Smoker  . Smokeless tobacco: Never Used  Substance Use Topics  . Alcohol use: Not Currently    Comment: rarely  . Drug use: Never     Allergies   Patient has no known allergies.   Review of Systems Review of Systems  Constitutional: Negative for fever.  Eyes: Positive for photophobia, pain (pressure) and visual disturbance. Negative for discharge, redness and itching.  Neurological: Positive for dizziness and numbness (resolved). Negative for tremors, seizures, syncope, facial asymmetry, speech difficulty, weakness, light-headedness and headaches.  Psychiatric/Behavioral: Positive for confusion.  All other systems reviewed and are negative.    Physical Exam Updated Vital Signs BP 128/74 (BP Location: Right Arm)  Pulse 86   Temp 98.3 F (36.8 C) (Oral)   Resp 16   LMP 05/15/2019   SpO2 100%   Physical Exam Vitals signs and nursing note reviewed.  Constitutional:      General: She is not in acute distress.    Appearance: Normal appearance. She is well-developed. She is not ill-appearing.     Comments: Calm, cooperative. NAD. Anisocoria noted  HENT:     Head: Normocephalic and atraumatic.  Eyes:     General: Lids are normal. No scleral icterus.       Right eye: No discharge.        Left eye: No discharge.     Extraocular Movements: Extraocular movements intact.     Conjunctiva/sclera: Conjunctivae normal.     Pupils: Pupils are unequal.     Right eye: Pupil is sluggish. Pupil is round and reactive.     Left eye: Pupil is round,  reactive and not sluggish.     Funduscopic exam:    Right eye: Red reflex present.        Left eye: Red reflex present.    Comments:   Visual Acuity  Right Eye Distance: 20/30 Left Eye Distance: 20/25 Bilateral Distance: 20/25    IOP Right: 17 IOP: Left: 18  Neck:     Musculoskeletal: Normal range of motion.  Cardiovascular:     Rate and Rhythm: Normal rate.  Pulmonary:     Effort: Pulmonary effort is normal. No respiratory distress.  Abdominal:     General: There is no distension.  Skin:    General: Skin is warm and dry.  Neurological:     Mental Status: She is alert and oriented to person, place, and time.     Comments: Mental Status:  Alert, oriented, thought content appropriate, able to give a coherent history. Speech fluent without evidence of aphasia. Able to follow 2 step commands without difficulty.  Cranial Nerves:  II:  Peripheral visual fields grossly normal. Pupils are unequal. Right is dilated and sluggish compared to left III,IV, VI: ptosis not present, extra-ocular motions intact bilaterally  V,VII: smile symmetric, facial light touch sensation equal VIII: hearing grossly normal to voice  X: uvula elevates symmetrically  XI: bilateral shoulder shrug symmetric and strong XII: midline tongue extension without fassiculations Motor:  Normal tone. 5/5 in upper and lower extremities bilaterally including strong and equal grip strength and dorsiflexion/plantar flexion Sensory: Pinprick and light touch normal in all extremities.  Cerebellar: normal finger-to-nose with bilateral upper extremities Gait: normal gait and balance CV: distal pulses palpable throughout    Psychiatric:        Behavior: Behavior normal.      ED Treatments / Results  Labs (all labs ordered are listed, but only abnormal results are displayed) Labs Reviewed - No data to display  EKG None  Radiology No results found.  Procedures Procedures (including critical care time)   Medications Ordered in ED Medications - No data to display   Initial Impression / Assessment and Plan / ED Course  I have reviewed the triage vital signs and the nursing notes.  Pertinent labs & imaging results that were available during my care of the patient were reviewed by me and considered in my medical decision making (see chart for details).  23 year old female presents with anisocoria for one week. Her visual acuity is intact. Her R pupil is dilated compared to L and is sluggish but reactive. She has not had any head trauma.  Ddx includes: mass, hemorrhage, stroke, MS, infection, toxins, diabetes. Her neurologic exam is essentially normal other than anisocoria. IOP is normal. Will discuss with neurology.  Labs are normal. CTA head/neck are negative. MRI brain is pending at shift change. Care signed out to Ardine EngM Fawze PA-C who will dispo   Final Clinical Impressions(s) / ED Diagnoses   Final diagnoses:  Anisocoria    ED Discharge Orders    None       Beryle QuantGekas, Rossie Scarfone Marie, PA-C 06/15/19 1515    Gwyneth SproutPlunkett, Whitney, MD 06/15/19 1546

## 2019-06-15 NOTE — Discharge Instructions (Signed)
Your work-up today was reassuring with no evidence of brain mass, brain bleed, tumor, or findings to suggest MS or stroke.  There are many different reasons why pupils can be different sizes, some of which are very concerning and others that are very benign.  Work-up today showed no evidence of any concerning cause of your symptoms.  Follow-up with neurology on an outpatient basis.  Return to the emergency department if any concerning signs or symptoms develop such as severe headaches, numbness or weakness of the extremities, persistent vomiting, fevers, or loss of consciousness.

## 2019-06-16 LAB — RPR: RPR Ser Ql: NONREACTIVE

## 2019-07-20 ENCOUNTER — Emergency Department (HOSPITAL_COMMUNITY)
Admission: EM | Admit: 2019-07-20 | Discharge: 2019-07-20 | Disposition: A | Discharge status: Home / Self Care | Payer: Managed Care, Other (non HMO) | Attending: Emergency Medicine | Admitting: Emergency Medicine

## 2019-07-20 ENCOUNTER — Emergency Department (HOSPITAL_COMMUNITY): Payer: Managed Care, Other (non HMO)

## 2019-07-20 ENCOUNTER — Other Ambulatory Visit: Payer: Self-pay

## 2019-07-20 ENCOUNTER — Encounter (HOSPITAL_COMMUNITY): Payer: Self-pay | Admitting: *Deleted

## 2019-07-20 DIAGNOSIS — R002 Palpitations: Secondary | ICD-10-CM | POA: Diagnosis not present

## 2019-07-20 DIAGNOSIS — Z79899 Other long term (current) drug therapy: Secondary | ICD-10-CM | POA: Insufficient documentation

## 2019-07-20 DIAGNOSIS — R0602 Shortness of breath: Secondary | ICD-10-CM

## 2019-07-20 DIAGNOSIS — R079 Chest pain, unspecified: Secondary | ICD-10-CM | POA: Diagnosis not present

## 2019-07-20 LAB — COMPREHENSIVE METABOLIC PANEL
ALT: 20 U/L (ref 0–44)
AST: 17 U/L (ref 15–41)
Albumin: 4.3 g/dL (ref 3.5–5.0)
Alkaline Phosphatase: 57 U/L (ref 38–126)
Anion gap: 11 (ref 5–15)
BUN: 14 mg/dL (ref 6–20)
CO2: 24 mmol/L (ref 22–32)
Calcium: 9.5 mg/dL (ref 8.9–10.3)
Chloride: 105 mmol/L (ref 98–111)
Creatinine, Ser: 0.52 mg/dL (ref 0.44–1.00)
GFR calc Af Amer: >60 mL/min (ref >60)
GFR calc non Af Amer: >60 mL/min (ref >60)
Glucose, Bld: 109 mg/dL — ABNORMAL HIGH (ref 70–99)
Potassium: 3.8 mmol/L (ref 3.5–5.1)
Sodium: 140 mmol/L (ref 135–145)
Total Bilirubin: 0.6 mg/dL (ref 0.3–1.2)
Total Protein: 7.7 g/dL (ref 6.5–8.1)

## 2019-07-20 LAB — CBC WITH DIFFERENTIAL/PLATELET
Abs Immature Granulocytes: 0 10*3/uL (ref 0.00–0.07)
Basophils Absolute: 0 10*3/uL (ref 0.0–0.1)
Basophils Relative: 0 %
Eosinophils Absolute: 0.1 10*3/uL (ref 0.0–0.5)
Eosinophils Relative: 2 %
HCT: 43.3 % (ref 36.0–46.0)
Hemoglobin: 14.1 g/dL (ref 12.0–15.0)
Immature Granulocytes: 0 %
Lymphocytes Relative: 32 %
Lymphs Abs: 1.1 10*3/uL (ref 0.7–4.0)
MCH: 29.1 pg (ref 26.0–34.0)
MCHC: 32.6 g/dL (ref 30.0–36.0)
MCV: 89.3 fL (ref 80.0–100.0)
Monocytes Absolute: 0.2 10*3/uL (ref 0.1–1.0)
Monocytes Relative: 6 %
Neutro Abs: 2.2 10*3/uL (ref 1.7–7.7)
Neutrophils Relative %: 60 %
Platelets: 146 10*3/uL — ABNORMAL LOW (ref 150–400)
RBC: 4.85 MIL/uL (ref 3.87–5.11)
RDW: 11.6 % (ref 11.5–15.5)
WBC: 3.6 10*3/uL — ABNORMAL LOW (ref 4.0–10.5)
nRBC: 0 % (ref 0.0–0.2)

## 2019-07-20 LAB — TSH: TSH: 0.725 u[IU]/mL (ref 0.350–4.500)

## 2019-07-20 LAB — D-DIMER, QUANTITATIVE: D-Dimer, Quant: 0.42 ug/mL-FEU (ref 0.00–0.50)

## 2019-07-20 MED ORDER — SODIUM CHLORIDE 0.9 % IV BOLUS
1000.0000 mL | Freq: Once | INTRAVENOUS | Status: AC
  Administered 2019-07-20: 10:00:00 1000 mL via INTRAVENOUS

## 2019-07-20 NOTE — ED Notes (Signed)
Ambulated to bathroom without difficulty, urine at bedside if needed

## 2019-07-20 NOTE — ED Provider Notes (Signed)
[facility] DEPT Provider Note   CSN: [id number] Arrival date & time: 07/20/19  4235     History Chief Complaint  Patient presents with  . Shortness of Breath  . Tachycardia    Stacie Eaton is a 23 y.o. female with a history of depression who presents to the emergency department with complaints of intermittent dyspnea and palpitations for the past 1 week.  Patient states that she is experiencing dyspnea associated with sensation that her heart is beating fairly hard/pounding which has been occurring intermittently, she states symptoms last for a few hours and occur a couple of times per day daily.  States symptoms are triggered by activity, improved by rest.  No other alleviating or aggravating factors.  No intervention prior to arrival.  She states that she has been drinking her typical 1 cup of coffee per day, no change in caffeine intake.  She did recently start birth control 2 weeks prior.  No other med changes.  Denies drug use.  Denies nausea, vomiting, diaphoresis, chest pain, lightheadedness, dizziness, syncope, leg pain/swelling, hemoptysis, recent surgery/trauma, recent long travel, personal hx of cancer, or hx of DVT/PE.  She is not having any current symptoms.     HPI     Past Medical History:  Diagnosis Date  . Depression    postpartum  . Urticaria     Patient Active Problem List   Diagnosis Date Noted  . SVD (spontaneous vaginal delivery) 02/20/2019  . Gestational thrombocytopenia ([facility]) 02/20/2019  . Oligohydramnios 02/18/2019  . Chronic urticaria 07/13/2016    Past Surgical History:  Procedure Laterality Date  . NO PAST SURGERIES       OB History    Gravida  2   Para  2   Term  2   Preterm      AB      Living  2     SAB      TAB      Ectopic      Multiple  0   Live Births  1           Family History  Problem Relation Age of Onset  . Allergic rhinitis Neg Hx   . Angioedema Neg Hx   . Asthma Neg  Hx   . Eczema Neg Hx   . Immunodeficiency Neg Hx   . Urticaria Neg Hx     Social History   Tobacco Use  . Smoking status: Never Smoker  . Smokeless tobacco: Never Used  Substance Use Topics  . Alcohol use: Not Currently    Comment: rarely  . Drug use: Never    Home Medications Prior to Admission medications   Medication Sig Start Date End Date Taking? Authorizing Provider  norethindrone-ethinyl estradiol (LOESTRIN FE) 1-20 MG-MCG tablet Take 1 tablet by mouth daily.   Yes [provider]  ibuprofen (ADVIL) 600 MG tablet Take 1 tablet (600 mg total) by mouth every 6 (six) hours. Patient not taking: Reported on 05/02/2019 02/20/19   Nicolette Bang, DO  senna-docusate (SENOKOT-S) 8.6-50 MG tablet Take 2 tablets by mouth daily. Patient not taking: Reported on 05/02/2019 02/21/19   Nicolette Bang, DO    Allergies    Patient has no known allergies.  Review of Systems   Review of Systems  Constitutional: Negative for chills, diaphoresis and fever.  HENT: Negative for congestion, ear pain and sore throat.   Respiratory: Positive for shortness of breath. Negative for cough.   Cardiovascular:  Positive for palpitations. Negative for chest pain and leg swelling.  Gastrointestinal: Negative for nausea and vomiting.  Neurological: Negative for dizziness, syncope, weakness, light-headedness and numbness.  All other systems reviewed and are negative.   Physical Exam Updated Vital Signs BP 113/74 (BP Location: Right Arm)   Pulse 90   Temp 97.9 F (36.6 C) (Oral)   Resp 18   Ht 5\' 1"  (1.549 m)   Wt 54.4 kg   SpO2 99%   BMI 22.67 kg/m   Physical Exam Vitals and nursing note reviewed.  Constitutional:      General: She is not in acute distress.    Appearance: She is well-developed. She is not toxic-appearing.  HENT:     Head: Normocephalic and atraumatic.  Eyes:     General:        Right eye: No discharge.        Left eye: No discharge.      Conjunctiva/sclera: Conjunctivae normal.  Cardiovascular:     Rate and Rhythm: Normal rate and regular rhythm.     Pulses:          Radial pulses are 2+ on the right side and 2+ on the left side.       Posterior tibial pulses are 2+ on the right side and 2+ on the left side.  Pulmonary:     Effort: Pulmonary effort is normal. No respiratory distress.     Breath sounds: Normal breath sounds. No wheezing, rhonchi or rales.  Abdominal:     General: There is no distension.     Palpations: Abdomen is soft.     Tenderness: There is no abdominal tenderness.  Musculoskeletal:     Cervical back: Neck supple.     Right lower leg: No tenderness. No edema.     Left lower leg: No tenderness. No edema.  Skin:    General: Skin is warm and dry.     Findings: No rash.  Neurological:     Mental Status: She is alert.     Comments: Clear speech.   Psychiatric:        Behavior: Behavior normal.    ED Results / Procedures / Treatments   Labs (all labs ordered are listed, but only abnormal results are displayed) Labs Reviewed  CBC WITH DIFFERENTIAL/PLATELET  COMPREHENSIVE METABOLIC PANEL  D-DIMER, QUANTITATIVE (NOT AT [facility])  TSH    EKG EKG Interpretation  Date/Time:  Saturday July 20 2019 09:21:10 EST Ventricular Rate:  103 PR Interval:    QRS Duration: 79 QT Interval:  324 QTC Calculation: 425 R Axis:   74 Text Interpretation: Sinus tachycardia Consider right atrial enlargement Borderline repolarization abnormality no significant change since Aug 2019 Confirmed by Pricilla LovelessGoldston, Scott [phone]([id number]) on 07/20/2019 10:19:10 AM   Radiology DG Chest 2 View  Result Date: 07/20/2019 CLINICAL DATA:  SOB, chest pain, increased heart rate shob EXAM: CHEST - 2 VIEW COMPARISON:  Chest radiograph 03/27/2018 FINDINGS: Normal mediastinum and cardiac silhouette. Normal pulmonary vasculature. No evidence of effusion, infiltrate, or pneumothorax. No acute bony abnormality. IMPRESSION: Normal chest radiograph.  Electronically Signed   By: Genevive BiStewart  Edmunds M.D.   On: 07/20/2019 09:37    Procedures Procedures (including critical care time)  Medications Ordered in ED Medications  sodium chloride 0.9 % bolus 1,000 mL (has no administration in time range)    ED Course/MDM  I have reviewed the triage vital signs and the nursing notes.  Pertinent labs & imaging results that were available during my  care of the patient were reviewed by me and considered in my medical decision making (see chart for details).   Patient presents to the emergency department with complaints of intermittent dyspnea and palpitations over the past 1 week.  Asymptomatic at present.  Heart regular rate and rhythm.  Lungs clear to auscultation bilaterally.  DDx: Arrhythmia, PE, anxiety, pneumonia, pneumothorax, CHF, anemia, thyroid dysfunction, electrolyte derangement.  Plan for chest x-ray, EKG, cardiac monitor, and labs.  Chest x-ray: Normal chest radiograph. EKG: Sinus tachycardia no significant change from last tracing CBC: Mild leukopenia and thrombocytopenia which will need PCP recheck.  No anemia. CMP: No electrolyte derangement.  Renal function and LFTs WNL. D-dimer: WNL.  Overall reassuring work-up. Low risk Wells, D-dimer WNL, doubt PE. EKG & cardiac monitoring without significant arrhythmia.  No critical anemia.  TSH: WNL  Overall appears appropriate for discharge home.  Will provide cardiology & PCP follow up information as I feel she may benefit from holter monitoring. I discussed results, treatment plan, need for follow-up, and return precautions with the patient. Provided opportunity for questions, patient confirmed understanding and is in agreement with plan.   Blood pressure 101/63, pulse 81, temperature 97.9 F (36.6 C), temperature source Oral, resp. rate 17, height 5\' 1"  (1.549 m), weight 54.4 kg, SpO2 100 %, unknown if currently breastfeeding.  Final Clinical Impression(s) / ED Diagnoses Final  diagnoses:  Palpitations  Shortness of breath    Rx / DC Orders ED Discharge Orders    None       , PA-C 07/20/19 1206    14/12/20, MD 07/20/19 1213

## 2019-07-20 NOTE — ED Triage Notes (Signed)
Pt has been having SHOB and tachycardia on and off for about a week. She states HR will go up to about 120 and her chest feels heavy, SHOB started about same time. 2 weeks ago started new birth control pill is only change noted

## 2019-07-20 NOTE — Discharge Instructions (Addendum)
You were seen in the emergency department today for trouble breathing and palpitations.  Your work-up was overall reassuring.  Your white blood cell count was mildly low at 3.6, normal is generally around 4-10, please have this rechecked by her primary care provider.  Your labs were otherwise fairly normal.  Your thyroid function testing returned and is normal. Your EKG and chest x-ray did not show any new findings.  We would like you to follow-up with a primary care provider or a cardiologist to discuss possible Holter monitoring to further evaluate your symptoms.  Please call the phone numbers listed in your discharge instructions to try to set an appointment within the next 3 days.  Return to the ER for new or worsening symptoms including but not limited to constant trouble breathing/palpitations, chest pain, passing out, dizziness, increased work of breathing, fever, cough, or any other concerns.

## 2019-07-25 ENCOUNTER — Ambulatory Visit: Payer: Medicaid Other | Admitting: Cardiology

## 2020-02-06 DIAGNOSIS — Z419 Encounter for procedure for purposes other than remedying health state, unspecified: Secondary | ICD-10-CM | POA: Diagnosis not present

## 2020-02-24 IMAGING — CR DG CHEST 2V
2 series · 2 of 2 positions shown · non-contrast
Comparison: Chest radiograph 03/27/2018

CLINICAL DATA: SOB, chest pain, increased heart rate shob

EXAM:
CHEST - 2 VIEW

[w chest pa]
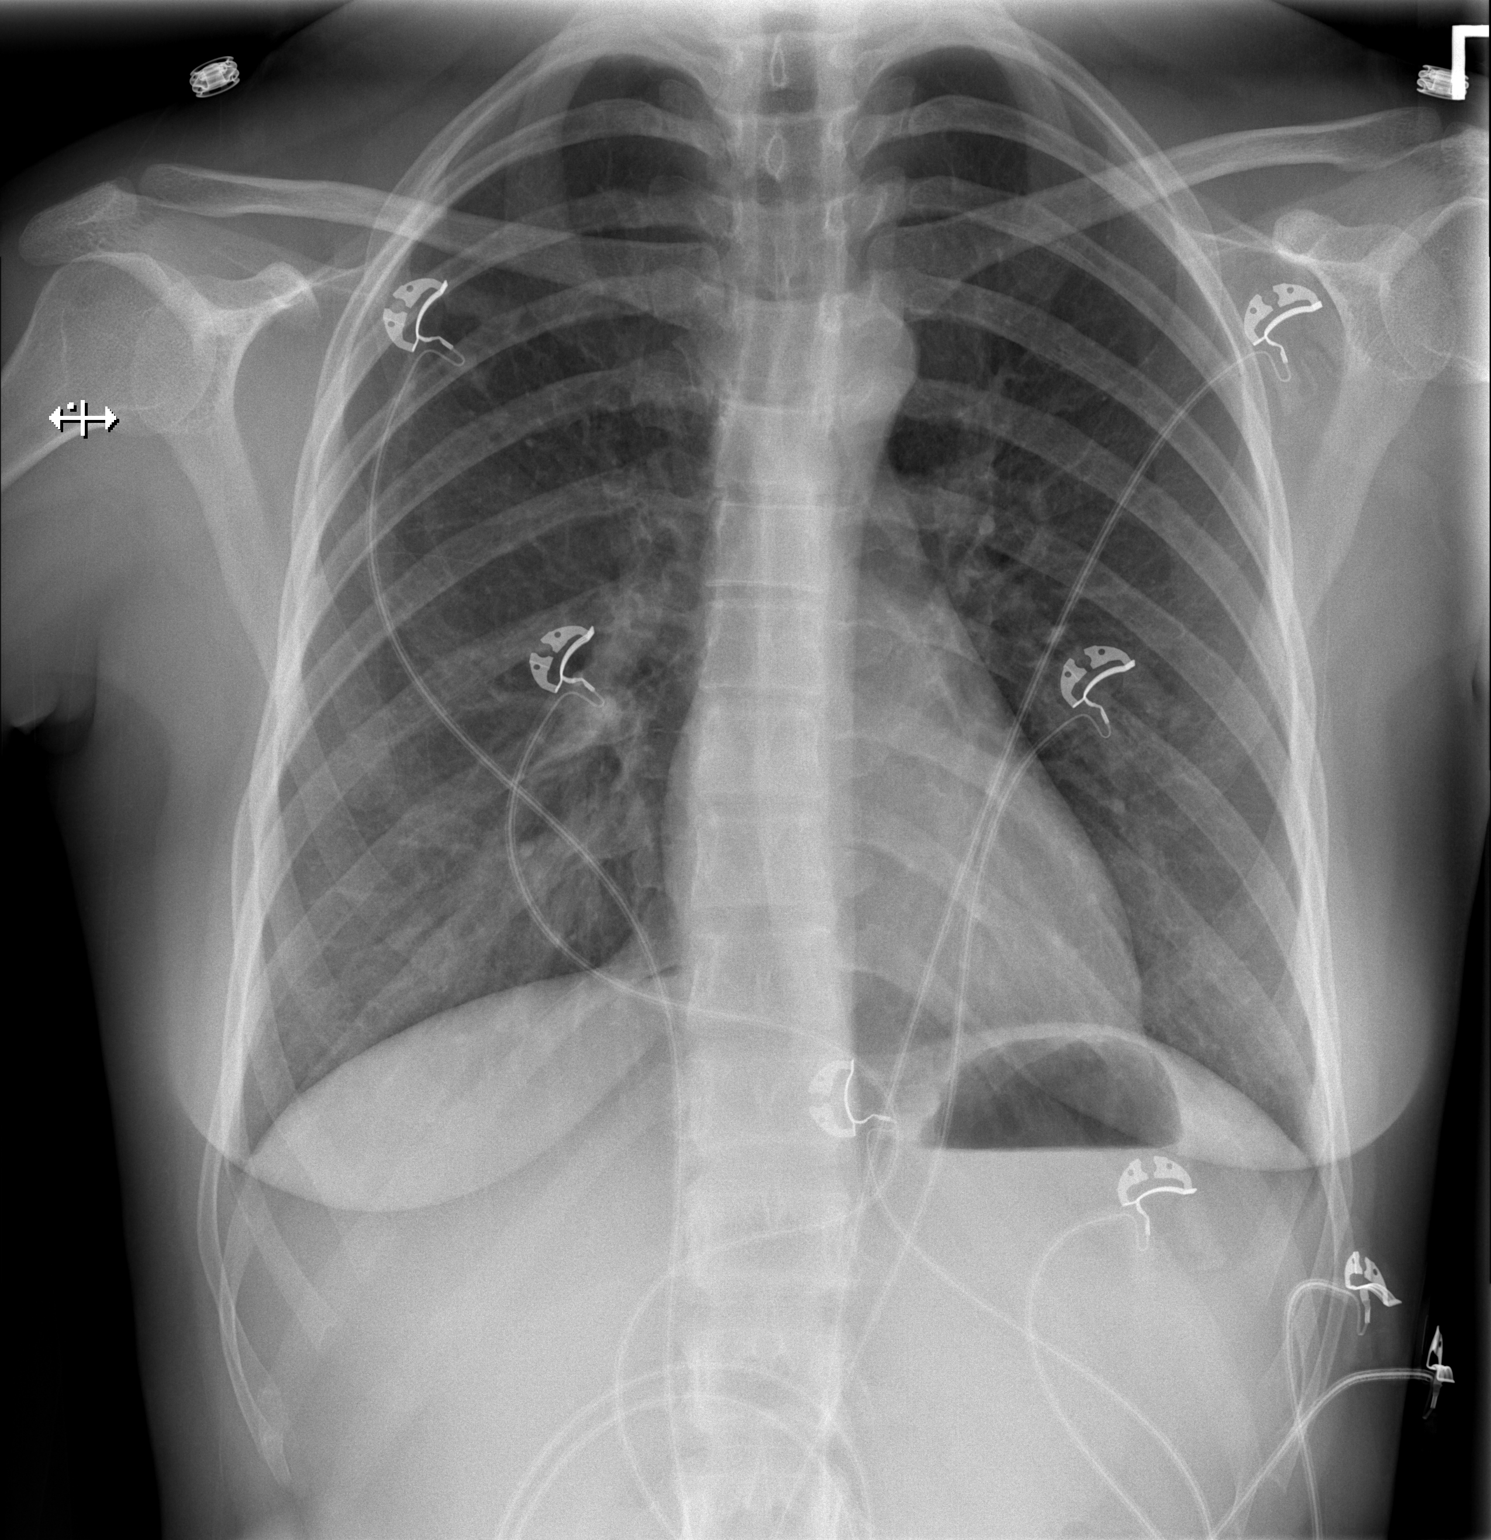

[w chest lat]
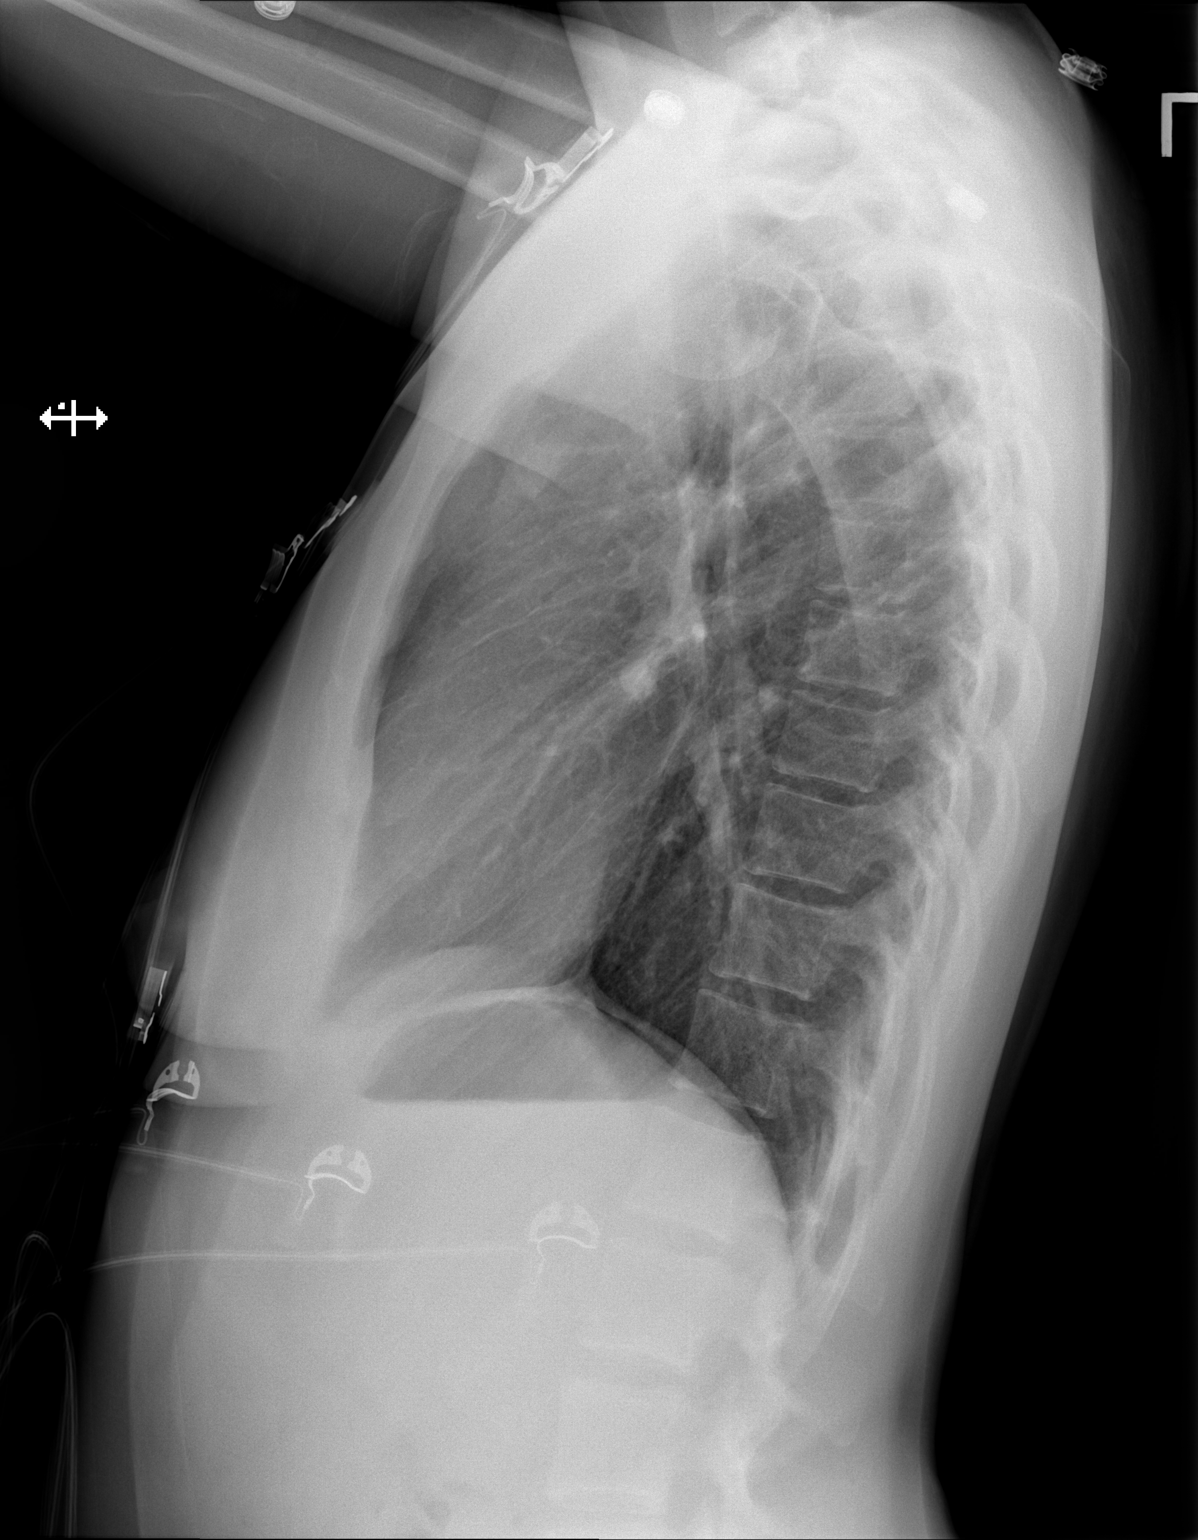

[2 of 2 positions shown; findings below may reference images not displayed]

FINDINGS: Normal mediastinum and cardiac silhouette. Normal pulmonary
vasculature. No evidence of effusion, infiltrate, or pneumothorax.
No acute bony abnormality.
IMPRESSION: Normal chest radiograph.

## 2020-03-08 DIAGNOSIS — Z419 Encounter for procedure for purposes other than remedying health state, unspecified: Secondary | ICD-10-CM | POA: Diagnosis not present

## 2020-03-20 DIAGNOSIS — Z01419 Encounter for gynecological examination (general) (routine) without abnormal findings: Secondary | ICD-10-CM | POA: Diagnosis not present

## 2020-03-20 DIAGNOSIS — Z6821 Body mass index (BMI) 21.0-21.9, adult: Secondary | ICD-10-CM | POA: Diagnosis not present

## 2020-03-20 DIAGNOSIS — Z118 Encounter for screening for other infectious and parasitic diseases: Secondary | ICD-10-CM | POA: Diagnosis not present

## 2020-03-20 DIAGNOSIS — Z23 Encounter for immunization: Secondary | ICD-10-CM | POA: Diagnosis not present

## 2020-04-08 DIAGNOSIS — Z419 Encounter for procedure for purposes other than remedying health state, unspecified: Secondary | ICD-10-CM | POA: Diagnosis not present

## 2020-04-28 DIAGNOSIS — Z0000 Encounter for general adult medical examination without abnormal findings: Secondary | ICD-10-CM | POA: Diagnosis not present

## 2020-04-28 DIAGNOSIS — Z23 Encounter for immunization: Secondary | ICD-10-CM | POA: Diagnosis not present

## 2020-04-28 DIAGNOSIS — Z136 Encounter for screening for cardiovascular disorders: Secondary | ICD-10-CM | POA: Diagnosis not present

## 2020-04-28 DIAGNOSIS — Z124 Encounter for screening for malignant neoplasm of cervix: Secondary | ICD-10-CM | POA: Diagnosis not present

## 2020-05-01 DIAGNOSIS — F419 Anxiety disorder, unspecified: Secondary | ICD-10-CM | POA: Diagnosis not present

## 2020-05-01 DIAGNOSIS — Z0000 Encounter for general adult medical examination without abnormal findings: Secondary | ICD-10-CM | POA: Diagnosis not present

## 2020-05-01 DIAGNOSIS — Z136 Encounter for screening for cardiovascular disorders: Secondary | ICD-10-CM | POA: Diagnosis not present

## 2020-05-08 DIAGNOSIS — Z419 Encounter for procedure for purposes other than remedying health state, unspecified: Secondary | ICD-10-CM | POA: Diagnosis not present

## 2020-06-08 DIAGNOSIS — R059 Cough, unspecified: Secondary | ICD-10-CM | POA: Diagnosis not present

## 2020-06-08 DIAGNOSIS — Z419 Encounter for procedure for purposes other than remedying health state, unspecified: Secondary | ICD-10-CM | POA: Diagnosis not present

## 2020-06-08 DIAGNOSIS — Z20822 Contact with and (suspected) exposure to covid-19: Secondary | ICD-10-CM | POA: Diagnosis not present

## 2020-07-08 DIAGNOSIS — Z419 Encounter for procedure for purposes other than remedying health state, unspecified: Secondary | ICD-10-CM | POA: Diagnosis not present

## 2020-08-08 DIAGNOSIS — Z419 Encounter for procedure for purposes other than remedying health state, unspecified: Secondary | ICD-10-CM | POA: Diagnosis not present

## 2020-08-12 ENCOUNTER — Ambulatory Visit (HOSPITAL_COMMUNITY)
Admission: EM | Admit: 2020-08-12 | Discharge: 2020-08-12 | Disposition: A | Discharge status: Home / Self Care | Payer: BC Managed Care – PPO | Attending: Family Medicine | Admitting: Family Medicine

## 2020-08-12 ENCOUNTER — Encounter (HOSPITAL_COMMUNITY): Payer: Self-pay

## 2020-08-12 DIAGNOSIS — N390 Urinary tract infection, site not specified: Secondary | ICD-10-CM | POA: Diagnosis present

## 2020-08-12 LAB — POCT URINALYSIS DIPSTICK, ED / UC
Bilirubin Urine: NEGATIVE
Glucose, UA: NEGATIVE mg/dL
Ketones, ur: NEGATIVE mg/dL
Nitrite: NEGATIVE
Protein, ur: NEGATIVE mg/dL
Specific Gravity, Urine: 1.02 (ref 1.005–1.030)
Urobilinogen, UA: 0.2 mg/dL (ref 0.0–1.0)
pH: 6 (ref 5.0–8.0)

## 2020-08-12 LAB — POC URINE PREG, ED: Preg Test, Ur: NEGATIVE

## 2020-08-12 MED ORDER — NITROFURANTOIN MONOHYD MACRO 100 MG PO CAPS: 100.0000 mg | ORAL_CAPSULE | Freq: Two times a day (BID) | ORAL | 0 refills | Status: AC

## 2020-08-12 NOTE — ED Provider Notes (Signed)
[facility]    CSN: [id number] Arrival date & time: 08/12/20  4098      History   Chief Complaint Chief Complaint  Patient presents with  . Urinary Frequency    HPI Stacie Eaton is a 25 y.o. female.   Stacie Eaton presents with complaints of urinary frequency and occasional dysuria, which started 4 days ago. No pelvic or back pain. No hematuria. No nausea vomiting or diarrhea. No fevers. No vaginal symptoms. LMP last week. Has had one UTI in the past which felt similar.    ROS per HPI, negative if not otherwise mentioned.      Past Medical History:  Diagnosis Date  . Depression    postpartum  . Urticaria     Patient Active Problem List   Diagnosis Date Noted  . SVD (spontaneous vaginal delivery) 02/20/2019  . Gestational thrombocytopenia ([facility]) 02/20/2019  . Oligohydramnios 02/18/2019  . Chronic urticaria 07/13/2016    Past Surgical History:  Procedure Laterality Date  . NO PAST SURGERIES      OB History    Gravida  2   Para  2   Term  2   Preterm      AB      Living  2     SAB      IAB      Ectopic      Multiple  0   Live Births  1            Home Medications    Prior to Admission medications   Medication Sig Start Date End Date Taking? Authorizing Provider  nitrofurantoin, macrocrystal-monohydrate, (MACROBID) 100 MG capsule Take 1 capsule (100 mg total) by mouth 2 (two) times daily for 5 days. 08/12/20 08/17/20 Yes Rhett Najera, Barron Alvine, NP  norethindrone-ethinyl estradiol (LOESTRIN FE) 1-20 MG-MCG tablet Take 1 tablet by mouth daily.    [provider]    Family History Family History  Problem Relation Age of Onset  . Allergic rhinitis Neg Hx   . Angioedema Neg Hx   . Asthma Neg Hx   . Eczema Neg Hx   . Immunodeficiency Neg Hx   . Urticaria Neg Hx     Social History Social History   Tobacco Use  . Smoking status: Never Smoker  . Smokeless tobacco: Never Used  Vaping Use  . Vaping Use: Never used   Substance Use Topics  . Alcohol use: Not Currently    Comment: rarely  . Drug use: Never     Allergies   Patient has no known allergies.   Review of Systems Review of Systems   Physical Exam Triage Vital Signs ED Triage Vitals  Enc Vitals Group     BP 08/12/20 1131 (!) 87/66     Pulse Rate 08/12/20 1131 88     Resp 08/12/20 1131 18     Temp 08/12/20 1131 97.9 F (36.6 C)     Temp src --      SpO2 08/12/20 1131 100 %     Weight --      Height --      Head Circumference --      Peak Flow --      Pain Score 08/12/20 1129 2     Pain Loc --      Pain Edu? --      Excl. in GC? --    No data found.  Updated Vital Signs BP 129/84 (BP Location: Left Arm) Comment: re-eval  Pulse 88   Temp 97.9 F (36.6 C)   Resp 18   LMP 08/03/2020 (Approximate)   SpO2 100%   Breastfeeding No   Visual Acuity Right Eye Distance:   Left Eye Distance:   Bilateral Distance:    Right Eye Near:   Left Eye Near:    Bilateral Near:     Physical Exam Constitutional:      General: She is not in acute distress.    Appearance: She is well-developed.  Cardiovascular:     Rate and Rhythm: Normal rate.  Pulmonary:     Effort: Pulmonary effort is normal.  Abdominal:     Tenderness: There is no abdominal tenderness. There is no right CVA tenderness or left CVA tenderness.  Skin:    General: Skin is warm and dry.  Neurological:     Mental Status: She is alert and oriented to person, place, and time.      UC Treatments / Results  Labs (all labs ordered are listed, but only abnormal results are displayed) Labs Reviewed  POCT URINALYSIS DIPSTICK, ED / UC - Abnormal; Notable for the following components:      Result Value   Hgb urine dipstick LARGE (*)    Leukocytes,Ua SMALL (*)    All other components within normal limits  URINE CULTURE  POC URINE PREG, ED    EKG   Radiology No results found.  Procedures Procedures (including critical care time)  Medications Ordered  in UC Medications - No data to display  Initial Impression / Assessment and Plan / UC Course  I have reviewed the triage vital signs and the nursing notes.  Pertinent labs & imaging results that were available during my care of the patient were reviewed by me and considered in my medical decision making (see chart for details).     Urine with hgb and small leuks, with symptoms consistent with uti. macrobid initiated pending urine culture. Return precautions provided. Patient verbalized understanding and agreeable to plan.   Final Clinical Impressions(s) / UC Diagnoses   Final diagnoses:  Lower urinary tract infectious disease     Discharge Instructions      I am treating for a urinary tract infection with a culture pending, to confirm this.  Complete course of antibiotics.  Drink plenty of water to empty bladder regularly. Avoid alcohol and caffeine as these may irritate the bladder.   If symptoms worsen or do not improve in the next week to return to be seen or to follow up with PCP.      ED Prescriptions    Medication Sig Dispense Auth. Provider   nitrofurantoin, macrocrystal-monohydrate, (MACROBID) 100 MG capsule Take 1 capsule (100 mg total) by mouth 2 (two) times daily for 5 days. 10 capsule Georgetta Haber, NP     PDMP not reviewed this encounter.   Georgetta Haber, NP 08/12/20 1209

## 2020-08-12 NOTE — Discharge Instructions (Signed)
I am treating for a urinary tract infection with a culture pending, to confirm this.  Complete course of antibiotics.  Drink plenty of water to empty bladder regularly. Avoid alcohol and caffeine as these may irritate the bladder.   If symptoms worsen or do not improve in the next week to return to be seen or to follow up with PCP.

## 2020-08-12 NOTE — ED Triage Notes (Signed)
Pt in with c/o burning during urination and urinary frequency that has been going on for 4 days now  States she took at home test for UTI and results were positive

## 2020-08-13 LAB — URINE CULTURE: Culture: NO GROWTH

## 2020-09-08 DIAGNOSIS — Z419 Encounter for procedure for purposes other than remedying health state, unspecified: Secondary | ICD-10-CM | POA: Diagnosis not present

## 2020-10-06 DIAGNOSIS — Z419 Encounter for procedure for purposes other than remedying health state, unspecified: Secondary | ICD-10-CM | POA: Diagnosis not present

## 2020-11-06 DIAGNOSIS — Z419 Encounter for procedure for purposes other than remedying health state, unspecified: Secondary | ICD-10-CM | POA: Diagnosis not present

## 2020-12-06 DIAGNOSIS — Z419 Encounter for procedure for purposes other than remedying health state, unspecified: Secondary | ICD-10-CM | POA: Diagnosis not present

## 2021-01-06 DIAGNOSIS — Z419 Encounter for procedure for purposes other than remedying health state, unspecified: Secondary | ICD-10-CM | POA: Diagnosis not present

## 2021-02-05 DIAGNOSIS — Z419 Encounter for procedure for purposes other than remedying health state, unspecified: Secondary | ICD-10-CM | POA: Diagnosis not present

## 2021-03-08 DIAGNOSIS — Z419 Encounter for procedure for purposes other than remedying health state, unspecified: Secondary | ICD-10-CM | POA: Diagnosis not present

## 2021-04-08 DIAGNOSIS — Z419 Encounter for procedure for purposes other than remedying health state, unspecified: Secondary | ICD-10-CM | POA: Diagnosis not present

## 2021-05-08 DIAGNOSIS — Z419 Encounter for procedure for purposes other than remedying health state, unspecified: Secondary | ICD-10-CM | POA: Diagnosis not present

## 2021-06-08 DIAGNOSIS — Z419 Encounter for procedure for purposes other than remedying health state, unspecified: Secondary | ICD-10-CM | POA: Diagnosis not present

## 2021-07-08 DIAGNOSIS — Z419 Encounter for procedure for purposes other than remedying health state, unspecified: Secondary | ICD-10-CM | POA: Diagnosis not present

## 2021-08-08 DIAGNOSIS — Z419 Encounter for procedure for purposes other than remedying health state, unspecified: Secondary | ICD-10-CM | POA: Diagnosis not present

## 2021-09-08 DIAGNOSIS — Z419 Encounter for procedure for purposes other than remedying health state, unspecified: Secondary | ICD-10-CM | POA: Diagnosis not present

## 2021-10-06 DIAGNOSIS — Z419 Encounter for procedure for purposes other than remedying health state, unspecified: Secondary | ICD-10-CM | POA: Diagnosis not present

## 2021-11-06 DIAGNOSIS — Z419 Encounter for procedure for purposes other than remedying health state, unspecified: Secondary | ICD-10-CM | POA: Diagnosis not present

## 2021-12-06 DIAGNOSIS — Z419 Encounter for procedure for purposes other than remedying health state, unspecified: Secondary | ICD-10-CM | POA: Diagnosis not present

## 2022-01-06 DIAGNOSIS — Z419 Encounter for procedure for purposes other than remedying health state, unspecified: Secondary | ICD-10-CM | POA: Diagnosis not present

## 2022-02-05 DIAGNOSIS — Z419 Encounter for procedure for purposes other than remedying health state, unspecified: Secondary | ICD-10-CM | POA: Diagnosis not present

## 2022-03-08 DIAGNOSIS — Z419 Encounter for procedure for purposes other than remedying health state, unspecified: Secondary | ICD-10-CM | POA: Diagnosis not present

## 2022-04-08 DIAGNOSIS — Z419 Encounter for procedure for purposes other than remedying health state, unspecified: Secondary | ICD-10-CM | POA: Diagnosis not present

## 2022-05-08 DIAGNOSIS — Z419 Encounter for procedure for purposes other than remedying health state, unspecified: Secondary | ICD-10-CM | POA: Diagnosis not present

## 2022-06-08 DIAGNOSIS — Z419 Encounter for procedure for purposes other than remedying health state, unspecified: Secondary | ICD-10-CM | POA: Diagnosis not present

## 2022-07-08 DIAGNOSIS — Z419 Encounter for procedure for purposes other than remedying health state, unspecified: Secondary | ICD-10-CM | POA: Diagnosis not present

## 2022-08-08 DIAGNOSIS — Z419 Encounter for procedure for purposes other than remedying health state, unspecified: Secondary | ICD-10-CM | POA: Diagnosis not present

## 2022-09-08 DIAGNOSIS — Z419 Encounter for procedure for purposes other than remedying health state, unspecified: Secondary | ICD-10-CM | POA: Diagnosis not present

## 2022-09-21 ENCOUNTER — Encounter (HOSPITAL_COMMUNITY): Payer: Self-pay | Admitting: Emergency Medicine

## 2022-09-21 ENCOUNTER — Other Ambulatory Visit: Payer: Self-pay

## 2022-09-21 ENCOUNTER — Emergency Department (HOSPITAL_COMMUNITY)
Admission: EM | Admit: 2022-09-21 | Discharge: 2022-09-21 | Disposition: A | Discharge status: Home / Self Care | Payer: Medicaid Other | Attending: Emergency Medicine | Admitting: Emergency Medicine

## 2022-09-21 DIAGNOSIS — N390 Urinary tract infection, site not specified: Secondary | ICD-10-CM

## 2022-09-21 DIAGNOSIS — R319 Hematuria, unspecified: Secondary | ICD-10-CM | POA: Diagnosis not present

## 2022-09-21 DIAGNOSIS — R300 Dysuria: Secondary | ICD-10-CM | POA: Diagnosis present

## 2022-09-21 LAB — URINALYSIS, ROUTINE W REFLEX MICROSCOPIC
Bilirubin Urine: NEGATIVE
Glucose, UA: NEGATIVE mg/dL
Ketones, ur: 5 mg/dL — AB
Nitrite: NEGATIVE
Protein, ur: 100 mg/dL — AB
RBC / HPF: >50 RBC/hpf (ref 0–5)
Specific Gravity, Urine: 1.017 (ref 1.005–1.030)
WBC, UA: >50 WBC/hpf (ref 0–5)
pH: 6 (ref 5.0–8.0)

## 2022-09-21 LAB — PREGNANCY, URINE: Preg Test, Ur: NEGATIVE

## 2022-09-21 MED ORDER — CEPHALEXIN 500 MG PO CAPS: 500.0000 mg | ORAL_CAPSULE | Freq: Four times a day (QID) | ORAL | 0 refills | Status: DC

## 2022-09-21 MED ORDER — CEPHALEXIN 500 MG PO CAPS
500.0000 mg | ORAL_CAPSULE | Freq: Once | ORAL | Status: AC
  Administered 2022-09-21: 500 mg via ORAL
  Filled 2022-09-21: qty 1

## 2022-09-21 NOTE — ED Provider Notes (Signed)
[facility] Provider Note   CSN: [id number] Arrival date & time: 09/21/22  [id number]     History  Chief Complaint  Patient presents with   Dysuria    Demariyah Cragun is a 27 y.o. female.  27 year old female with prior medical history as detailed below presents for evaluation.  Patient reports symptoms consistent and concerning for UTI.  Symptoms began 4 days ago.  Patient reports increased frequency of urination.  Patient reports dysuria with urination.  Patient noted some faint bloody tinged urine this morning.  Patient denies recent UTI.  Patient has not been on any antibiotics in quite some time.  Patient finished her period last week.  She is certain that she is not pregnant.  She used cranberry juice and increased fluids over the last several days without control of her improvement in her symptoms.  She denies back pain, fever, vomiting, other complaint.    The history is provided by the patient and medical records.       Home Medications Prior to Admission medications   Medication Sig Start Date End Date Taking? Authorizing Provider  norethindrone-ethinyl estradiol (LOESTRIN FE) 1-20 MG-MCG tablet Take 1 tablet by mouth daily.    [provider]      Allergies    Patient has no known allergies.    Review of Systems   Review of Systems  All other systems reviewed and are negative.   Physical Exam Updated Vital Signs BP 119/83 (BP Location: Left Arm)   Pulse 100   Temp (!) 97.5 F (36.4 C) (Oral)   Resp 18   Ht 5' 1"$  (1.549 m)   Wt 45.4 kg   LMP 09/17/2022   SpO2 100%   BMI 18.89 kg/m  Physical Exam Vitals and nursing note reviewed.  Constitutional:      General: She is not in acute distress.    Appearance: Normal appearance. She is well-developed.  HENT:     Head: Normocephalic and atraumatic.  Eyes:     Conjunctiva/sclera: Conjunctivae normal.     Pupils: Pupils are equal, round, and reactive to  light.  Cardiovascular:     Rate and Rhythm: Normal rate and regular rhythm.     Heart sounds: Normal heart sounds.  Pulmonary:     Effort: Pulmonary effort is normal. No respiratory distress.     Breath sounds: Normal breath sounds.  Abdominal:     General: There is no distension.     Palpations: Abdomen is soft.     Tenderness: There is no abdominal tenderness.  Musculoskeletal:        General: No deformity. Normal range of motion.     Cervical back: Normal range of motion and neck supple.  Skin:    General: Skin is warm and dry.  Neurological:     General: No focal deficit present.     Mental Status: She is alert and oriented to person, place, and time.     ED Results / Procedures / Treatments   Labs (all labs ordered are listed, but only abnormal results are displayed) Labs Reviewed  URINALYSIS, ROUTINE W REFLEX MICROSCOPIC - Abnormal; Notable for the following components:      Result Value   APPearance CLOUDY (*)    Hgb urine dipstick LARGE (*)    Ketones, ur 5 (*)    Protein, ur 100 (*)    Leukocytes,Ua LARGE (*)    Bacteria, UA MANY (*)    All  other components within normal limits  PREGNANCY, URINE    EKG None  Radiology No results found.  Procedures Procedures    Medications Ordered in ED Medications - No data to display  ED Course/ Medical Decision Making/ A&P                             Medical Decision Making Amount and/or Complexity of Data Reviewed Labs: ordered.  Risk Prescription drug management.    Medical Screen Complete  This patient presented to the ED with complaint of increased urinary frequency, dysuria.  This complaint involves an extensive number of treatment options. The initial differential diagnosis includes, but is not limited to, UTI  This presentation is: Acute, Previously Undiagnosed, and Uncertain Prognosis  Patient is presenting with symptoms consistent with UTI.  Patient with UA concerning for acute  UTI.  Patient would benefit from course of outpatient antibiotics.  Patient understands need to complete her course of antibiotics.  Importance of close follow-up is stressed.  Strict return precautions given and understood. Additional history obtained:  External records from outside sources obtained and reviewed including prior ED visits and prior Inpatient records.    Lab Tests:  I ordered and personally interpreted labs.  The pertinent results include:  UA, HCG  Medicines ordered:  I ordered medication including keflex  for UTI  Reevaluation of the patient after these medicines showed that the patient: improved   Problem List / ED Course:  UTI   Reevaluation:  After the interventions noted above, I reevaluated the patient and found that they have: improved   Disposition:  After consideration of the diagnostic results and the patients response to treatment, I feel that the patent would benefit from close outpatient followup.          Final Clinical Impression(s) / ED Diagnoses Final diagnoses:  Urinary tract infection with hematuria, site unspecified    Rx / DC Orders ED Discharge Orders          Ordered    cephALEXin (KEFLEX) 500 MG capsule  4 times daily        09/21/22 0919              Valarie Merino, MD 09/21/22 [phone]

## 2022-09-21 NOTE — Discharge Instructions (Signed)
Return for any problem.   Take antibiotics as prescribed for treatment of urinary tract infection.  Urine sample provided demonstrates evidence of likely infection.  Pregnancy test is negative.

## 2022-09-21 NOTE — ED Triage Notes (Signed)
Patient arrives ambulatory by POV c/o UTI symptoms x 4 days. Having dysuria and hematuria. Tried OTC meds and cranberry juice w/o relief.

## 2022-09-23 ENCOUNTER — Telehealth: Payer: Self-pay | Admitting: *Deleted

## 2022-09-23 NOTE — Transitions of Care (Post Inpatient/ED Visit) (Signed)
   09/23/2022  Name: Stacie Eaton MRN: [id number] DOB: 08-14-1995  Today's TOC FU Call Status: Today's TOC FU Call Status:: Unsuccessul Call (1st Attempt) Unsuccessful Call (1st Attempt) Date: 09/23/22  Attempted to reach the patient regarding the most recent Inpatient/ED visit.  Follow Up Plan: Additional outreach attempts will be made to reach the patient to complete the Transitions of Care (Post Inpatient/ED visit) call.   Lurena Joiner RN, BSN [facility]  [employer] manager

## 2022-10-07 DIAGNOSIS — Z419 Encounter for procedure for purposes other than remedying health state, unspecified: Secondary | ICD-10-CM | POA: Diagnosis not present

## 2022-11-07 DIAGNOSIS — Z419 Encounter for procedure for purposes other than remedying health state, unspecified: Secondary | ICD-10-CM | POA: Diagnosis not present

## 2022-12-07 DIAGNOSIS — Z419 Encounter for procedure for purposes other than remedying health state, unspecified: Secondary | ICD-10-CM | POA: Diagnosis not present

## 2022-12-22 ENCOUNTER — Ambulatory Visit (INDEPENDENT_AMBULATORY_CARE_PROVIDER_SITE_OTHER): Payer: Medicaid Other | Admitting: Nurse Practitioner

## 2022-12-22 ENCOUNTER — Encounter: Payer: Self-pay | Admitting: Nurse Practitioner

## 2022-12-22 VITALS — BP 100/66 | HR 106 | Temp 97.4°F | Ht 61.0 in | Wt 95.0 lb

## 2022-12-22 DIAGNOSIS — Z1329 Encounter for screening for other suspected endocrine disorder: Secondary | ICD-10-CM

## 2022-12-22 DIAGNOSIS — E559 Vitamin D deficiency, unspecified: Secondary | ICD-10-CM

## 2022-12-22 DIAGNOSIS — N926 Irregular menstruation, unspecified: Secondary | ICD-10-CM

## 2022-12-22 LAB — POCT URINE PREGNANCY: Preg Test, Ur: NEGATIVE

## 2022-12-22 MED ORDER — NORGESTIMATE-ETH ESTRADIOL 0.25-35 MG-MCG PO TABS: 1.0000 | ORAL_TABLET | Freq: Every day | ORAL | 11 refills | Status: AC

## 2022-12-22 NOTE — Patient Instructions (Addendum)
1. Vitamin D deficiency  - Vitamin D, 25-hydroxy   2. Irregular menstrual bleeding  - norgestimate-ethinyl estradiol (ORTHO-CYCLEN) 0.25-35 MG-MCG tablet; Take 1 tablet by mouth daily.  Dispense: 28 tablet; Refill: 11 - CBC - Comprehensive metabolic panel - Iron, TIBC and Ferritin Panel - POCT urine pregnancy   3. Thyroid disorder screen - Thyroid Panel With TSH   Follow up:  Follow up in 2 months

## 2022-12-22 NOTE — Progress Notes (Signed)
@Patient  ID: [name], female    DOB: [birth year], 27 y.o.   MRN: [id number]  Chief Complaint  Patient presents with   Establish Care    Abnormal menstrual cycles.    Referring provider: No ref. provider found   HPI  Patient presents today to establish care.  She complains today of abnormal menstrual cycles.  She states that for the past 2 months her menstrual cycles have lasted around 10 days and have been very heavy.  She states that menstrual cramping is no worse than usual.  She was previously on prescription birth control pill and switch to over-the-counter birth control from [facility].  We will trial a different birth control with her today.  She states that she does not think she would be pregnant.  We will check pregnancy test in office.  Patient is concerned that she may be anemic from having such heavy menstrual cycles.  We will check labs today.  We discussed that switching birth control pills frequently could cause irregular menstrual cycles. Denies f/c/s, n/v/d, hemoptysis, PND, leg swelling Denies chest pain or edema    2 month - over 10 days heavy - menstrual cramps about the - last mentrual cycle to start this week       No Known Allergies  There is no immunization history for the selected administration types on file for this patient.  Past Medical History:  Diagnosis Date   Depression    postpartum   Urticaria     Tobacco History: Social History   Tobacco Use  Smoking Status Never  Smokeless Tobacco Never   Counseling given: Not Answered   Outpatient Encounter Medications as of 12/22/2022  Medication Sig   cephALEXin (KEFLEX) 500 MG capsule Take 1 capsule (500 mg total) by mouth 4 (four) times daily.   norgestimate-ethinyl estradiol (ORTHO-CYCLEN) 0.25-35 MG-MCG tablet Take 1 tablet by mouth daily.   [DISCONTINUED] norethindrone-ethinyl estradiol (LOESTRIN FE) 1-20 MG-MCG tablet Take 1 tablet by mouth daily. (Patient not taking: Reported on  12/22/2022)   No facility-administered encounter medications on file as of 12/22/2022.     Review of Systems  Review of Systems  Constitutional: Negative.   HENT: Negative.    Cardiovascular: Negative.   Gastrointestinal: Negative.   Allergic/Immunologic: Negative.   Neurological: Negative.   Psychiatric/Behavioral: Negative.         Physical Exam  BP 100/66   Pulse (!) 106   Temp (!) 97.4 F (36.3 C)   Ht 5\' 1"  (1.549 m)   Wt 95 lb (43.1 kg)   LMP 12/22/2022   SpO2 100%   BMI 17.95 kg/m   Wt Readings from Last 5 Encounters:  12/22/22 95 lb (43.1 kg)  09/21/22 100 lb (45.4 kg)  07/20/19 120 lb (54.4 kg)  02/18/19 137 lb (62.1 kg)  02/04/19 134 lb 3 oz (60.9 kg)     Physical Exam Vitals and nursing note reviewed.  Constitutional:      General: She is not in acute distress.    Appearance: She is well-developed.  Cardiovascular:     Rate and Rhythm: Normal rate and regular rhythm.  Pulmonary:     Effort: Pulmonary effort is normal.     Breath sounds: Normal breath sounds.  Neurological:     Mental Status: She is alert and oriented to person, place, and time.      Lab Results:  CBC    Component Value Date/Time   WBC 3.6 (L) 07/20/2019 1010   RBC 4.85  07/20/2019 1010   HGB 14.1 07/20/2019 1010   HCT 43.3 07/20/2019 1010   PLT 146 (L) 07/20/2019 1010   MCV 89.3 07/20/2019 1010   MCH 29.1 07/20/2019 1010   MCHC 32.6 07/20/2019 1010   RDW 11.6 07/20/2019 1010   LYMPHSABS 1.1 07/20/2019 1010   MONOABS 0.2 07/20/2019 1010   EOSABS 0.1 07/20/2019 1010   BASOSABS 0.0 07/20/2019 1010    BMET    Component Value Date/Time   NA 140 07/20/2019 1010   K 3.8 07/20/2019 1010   CL 105 07/20/2019 1010   CO2 24 07/20/2019 1010   GLUCOSE 109 (H) 07/20/2019 1010   BUN 14 07/20/2019 1010   CREATININE 0.52 07/20/2019 1010   CREATININE 0.60 07/13/2016 1013   CALCIUM 9.5 07/20/2019 1010   GFRNONAA >60 07/20/2019 1010   GFRNONAA >89 07/13/2016 1013   GFRAA  >60 07/20/2019 1010   GFRAA >89 07/13/2016 1013     Assessment & Plan:   Vitamin D deficiency - Vitamin D, 25-hydroxy   2. Irregular menstrual bleeding  - norgestimate-ethinyl estradiol (ORTHO-CYCLEN) 0.25-35 MG-MCG tablet; Take 1 tablet by mouth daily.  Dispense: 28 tablet; Refill: 11 - CBC - Comprehensive metabolic panel - Iron, TIBC and Ferritin Panel - POCT urine pregnancy   3. Thyroid disorder screen - Thyroid Panel With TSH   Follow up:  Follow up in 2 months     Ivonne Andrew, NP 12/22/2022

## 2022-12-22 NOTE — Assessment & Plan Note (Signed)
-   Vitamin D, 25-hydroxy   2. Irregular menstrual bleeding  - norgestimate-ethinyl estradiol (ORTHO-CYCLEN) 0.25-35 MG-MCG tablet; Take 1 tablet by mouth daily.  Dispense: 28 tablet; Refill: 11 - CBC - Comprehensive metabolic panel - Iron, TIBC and Ferritin Panel - POCT urine pregnancy   3. Thyroid disorder screen - Thyroid Panel With TSH   Follow up:  Follow up in 2 months

## 2022-12-23 LAB — COMPREHENSIVE METABOLIC PANEL
ALT: 16 IU/L (ref 0–32)
AST: 12 IU/L (ref 0–40)
Albumin/Globulin Ratio: 2.2 (ref 1.2–2.2)
Albumin: 4.8 g/dL (ref 4.0–5.0)
Alkaline Phosphatase: 56 IU/L (ref 44–121)
BUN/Creatinine Ratio: 16 (ref 9–23)
BUN: 10 mg/dL (ref 6–20)
Bilirubin Total: 0.5 mg/dL (ref 0.0–1.2)
CO2: 22 mmol/L (ref 20–29)
Calcium: 9.4 mg/dL (ref 8.7–10.2)
Chloride: 100 mmol/L (ref 96–106)
Creatinine, Ser: 0.62 mg/dL (ref 0.57–1.00)
Globulin, Total: 2.2 g/dL (ref 1.5–4.5)
Glucose: 99 mg/dL (ref 70–99)
Potassium: 3.9 mmol/L (ref 3.5–5.2)
Sodium: 138 mmol/L (ref 134–144)
Total Protein: 7 g/dL (ref 6.0–8.5)
eGFR: 126 mL/min/{1.73_m2} (ref >59)

## 2022-12-23 LAB — IRON,TIBC AND FERRITIN PANEL
Ferritin: 27 ng/mL (ref 15–150)
Iron Saturation: 38 % (ref 15–55)
Iron: 135 ug/dL (ref 27–159)
Total Iron Binding Capacity: 353 ug/dL (ref 250–450)
UIBC: 218 ug/dL (ref 131–425)

## 2022-12-23 LAB — THYROID PANEL WITH TSH
Free Thyroxine Index: 2.2 (ref 1.2–4.9)
T3 Uptake Ratio: 27 % (ref 24–39)
T4, Total: 8 ug/dL (ref 4.5–12.0)
TSH: 1.6 u[IU]/mL (ref 0.450–4.500)

## 2022-12-23 LAB — CBC
Hematocrit: 43.9 % (ref 34.0–46.6)
Hemoglobin: 14 g/dL (ref 11.1–15.9)
MCH: 29.2 pg (ref 26.6–33.0)
MCHC: 31.9 g/dL (ref 31.5–35.7)
MCV: 92 fL (ref 79–97)
Platelets: 144 10*3/uL — ABNORMAL LOW (ref 150–450)
RBC: 4.8 x10E6/uL (ref 3.77–5.28)
RDW: 12.1 % (ref 11.7–15.4)
WBC: 5.1 10*3/uL (ref 3.4–10.8)

## 2022-12-23 LAB — VITAMIN D 25 HYDROXY (VIT D DEFICIENCY, FRACTURES): Vit D, 25-Hydroxy: 39.3 ng/mL (ref 30.0–100.0)

## 2023-01-01 ENCOUNTER — Ambulatory Visit (HOSPITAL_COMMUNITY)
Admission: EM | Admit: 2023-01-01 | Discharge: 2023-01-02 | Disposition: A | Discharge status: Home / Self Care | Payer: Medicaid Other | Attending: Family | Admitting: Family

## 2023-01-01 DIAGNOSIS — F419 Anxiety disorder, unspecified: Secondary | ICD-10-CM | POA: Insufficient documentation

## 2023-01-01 DIAGNOSIS — Z79899 Other long term (current) drug therapy: Secondary | ICD-10-CM | POA: Diagnosis not present

## 2023-01-01 DIAGNOSIS — F329 Major depressive disorder, single episode, unspecified: Secondary | ICD-10-CM | POA: Diagnosis not present

## 2023-01-01 DIAGNOSIS — F22 Delusional disorders: Secondary | ICD-10-CM | POA: Insufficient documentation

## 2023-01-01 DIAGNOSIS — Z9152 Personal history of nonsuicidal self-harm: Secondary | ICD-10-CM | POA: Diagnosis not present

## 2023-01-01 DIAGNOSIS — R45851 Suicidal ideations: Secondary | ICD-10-CM | POA: Diagnosis not present

## 2023-01-01 LAB — COMPREHENSIVE METABOLIC PANEL
ALT: <5 U/L (ref 0–44)
AST: 11 U/L — ABNORMAL LOW (ref 15–41)
Albumin: 4.5 g/dL (ref 3.5–5.0)
Alkaline Phosphatase: 52 U/L (ref 38–126)
Anion gap: 10 (ref 5–15)
BUN: 8 mg/dL (ref 6–20)
CO2: 22 mmol/L (ref 22–32)
Calcium: 9.6 mg/dL (ref 8.9–10.3)
Chloride: 102 mmol/L (ref 98–111)
Creatinine, Ser: 0.56 mg/dL (ref 0.44–1.00)
GFR, Estimated: >60 mL/min (ref >60)
Glucose, Bld: 129 mg/dL — ABNORMAL HIGH (ref 70–99)
Potassium: 3.2 mmol/L — ABNORMAL LOW (ref 3.5–5.1)
Sodium: 134 mmol/L — ABNORMAL LOW (ref 135–145)
Total Bilirubin: 1.1 mg/dL (ref 0.3–1.2)
Total Protein: 7.8 g/dL (ref 6.5–8.1)

## 2023-01-01 LAB — POCT PREGNANCY, URINE: Preg Test, Ur: NEGATIVE

## 2023-01-01 LAB — CBC WITH DIFFERENTIAL/PLATELET
Abs Immature Granulocytes: 0.02 10*3/uL (ref 0.00–0.07)
Basophils Absolute: 0 10*3/uL (ref 0.0–0.1)
Basophils Relative: 0 %
Eosinophils Absolute: 0 10*3/uL (ref 0.0–0.5)
Eosinophils Relative: 0 %
HCT: 41.5 % (ref 36.0–46.0)
Hemoglobin: 14 g/dL (ref 12.0–15.0)
Immature Granulocytes: 0 %
Lymphocytes Relative: 14 %
Lymphs Abs: 1.2 10*3/uL (ref 0.7–4.0)
MCH: 29 pg (ref 26.0–34.0)
MCHC: 33.7 g/dL (ref 30.0–36.0)
MCV: 86.1 fL (ref 80.0–100.0)
Monocytes Absolute: 0.3 10*3/uL (ref 0.1–1.0)
Monocytes Relative: 3 %
Neutro Abs: 7 10*3/uL (ref 1.7–7.7)
Neutrophils Relative %: 83 %
Platelets: 145 10*3/uL — ABNORMAL LOW (ref 150–400)
RBC: 4.82 MIL/uL (ref 3.87–5.11)
RDW: 11.9 % (ref 11.5–15.5)
WBC: 8.6 10*3/uL (ref 4.0–10.5)
nRBC: 0 % (ref 0.0–0.2)

## 2023-01-01 LAB — POC URINE PREG, ED: Preg Test, Ur: NEGATIVE

## 2023-01-01 LAB — POCT URINE DRUG SCREEN - MANUAL ENTRY (I-SCREEN)
POC Amphetamine UR: NOT DETECTED
POC Buprenorphine (BUP): NOT DETECTED
POC Cocaine UR: NOT DETECTED
POC Marijuana UR: POSITIVE — AB
POC Methadone UR: NOT DETECTED
POC Methamphetamine UR: NOT DETECTED
POC Morphine: NOT DETECTED
POC Oxazepam (BZO): NOT DETECTED
POC Oxycodone UR: NOT DETECTED
POC Secobarbital (BAR): NOT DETECTED

## 2023-01-01 LAB — TSH: TSH: 1.497 u[IU]/mL (ref 0.350–4.500)

## 2023-01-01 LAB — LIPID PANEL
Cholesterol: 158 mg/dL (ref 0–200)
HDL: 60 mg/dL (ref >40)
LDL Cholesterol: 92 mg/dL (ref 0–99)
Total CHOL/HDL Ratio: 2.6 RATIO
Triglycerides: 30 mg/dL (ref <150)
VLDL: 6 mg/dL (ref 0–40)

## 2023-01-01 MED ORDER — TRAZODONE HCL 50 MG PO TABS
50.0000 mg | ORAL_TABLET | Freq: Every evening | ORAL | Status: DC | PRN
  Administered 2023-01-01 (×2): 50 mg via ORAL
  Filled 2023-01-01 (×2): qty 1

## 2023-01-01 MED ORDER — SERTRALINE HCL 25 MG PO TABS: 25.0000 mg | ORAL_TABLET | Freq: Every day | ORAL | Status: DC

## 2023-01-01 MED ORDER — ARIPIPRAZOLE 5 MG PO TABS
5.0000 mg | ORAL_TABLET | Freq: Once | ORAL | Status: AC
  Administered 2023-01-01: 5 mg via ORAL
  Filled 2023-01-01: qty 1

## 2023-01-01 MED ORDER — ACETAMINOPHEN 325 MG PO TABS: 650.0000 mg | ORAL_TABLET | Freq: Four times a day (QID) | ORAL | Status: DC | PRN

## 2023-01-01 MED ORDER — ALUM & MAG HYDROXIDE-SIMETH 200-200-20 MG/5ML PO SUSP: 30.0000 mL | ORAL | Status: DC | PRN

## 2023-01-01 MED ORDER — MAGNESIUM HYDROXIDE 400 MG/5ML PO SUSP: 30.0000 mL | Freq: Every day | ORAL | Status: DC | PRN

## 2023-01-01 NOTE — ED Provider Notes (Signed)
[facility] Continuous Assessment Admission H&P  Date: 01/01/23 Patient Name: Stacie Eaton MRN: [id number] Chief Complaint: " My mind keeps racing and it's like my brain is on the loop."  Diagnoses:  Final diagnoses:  Suicidal ideation    HPI: Stacie Eaton 27 year old female presents to [facility] reporting suicidal ideations and increased anxiety.  States her symptoms started last night when she was watching true crime television.  States her brain  was thinking of all the negative things that she could do and images continue to replaying "over and over".  She denies that she is followed by therapy or psychiatry currently.  Denied previous mental health diagnoses.  Denied illicit drug use or substance abuse history.  States she has a history of self injures behaviors however has not self harmed in quite a while.  She reports experiencing a previous episode like this in the past.  Reports ongoing paranoia ideations.  Reports a fair appetite.  States she is resting okay throughout the night.  Patient was receptive to inpatient admissions.  will initiate Abilify and trazodone for mood stabilization.  Patient reports a family history with bipolar disorder maternal side.  States she is currently unemployed.  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   Latorya Swofford is sitting; intensely staring and is slightly pressured.  She is alert/oriented x 3; calm/cooperative; and mood congruent with affect.  Patient is speaking in a clear tone at moderate volume, and normal pace; with good eye contact. Her thought process is coherent and relevant; There is no indication that she is currently responding to internal/external stimuli or experiencing delusional thought content.  Patient denies homicidal ideation.   Patient has remained calm throughout assessment and has answered questions appropriately   Total Time spent with patient: 15 minutes  Musculoskeletal  Strength & Muscle Tone: within  normal limits Gait & Station: normal Patient leans: N/A  Psychiatric Specialty Exam  Presentation General Appearance: Disheveled  Eye Contact: staring intensely Speech:Clear and Coherent  Speech Volume:Normal  Handedness:Right   Mood and Affect  Mood:Anxious; Depressed  Affect:Congruent   Thought Process  Thought Processes:Coherent  Descriptions of Associations:Intact  Orientation:Full (Time, Place and Person)  Thought Content:Logical    Hallucinations:Hallucinations: None  Ideas of Reference:None  Suicidal Thoughts:Suicidal Thoughts: No  Homicidal Thoughts:Homicidal Thoughts: No   Sensorium  Memory:Immediate Good; Recent Good; Remote Good  Judgment:Good  Insight:Fair   Executive Functions  Concentration:Fair  Attention Span:Fair  Recall:Good  Fund of Knowledge:Good  Language:Good   Psychomotor Activity  Psychomotor Activity:Psychomotor Activity: Normal   Assets  Assets:Housing; Social Support   Sleep  Sleep:Sleep: Fair   Nutritional Assessment (For OBS and FBC admissions only) Has the patient had a weight loss or gain of 10 pounds or more in the last 3 months?: No Has the patient had a decrease in food intake/or appetite?: No Does the patient have dental problems?: No Does the patient have eating habits or behaviors that may be indicators of an eating disorder including binging or inducing vomiting?: No Has the patient recently lost weight without trying?: 0 Has the patient been eating poorly because of a decreased appetite?: 0 Malnutrition Screening Tool Score: 0    Physical Exam Vitals and nursing note reviewed.  Constitutional:      Appearance: Normal appearance.  Cardiovascular:     Rate and Rhythm: Normal rate and regular rhythm.  Pulmonary:     Effort: Pulmonary effort is normal.     Breath sounds: Normal breath sounds.  Neurological:     Mental Status: She is alert and oriented to person, place, and time.   Psychiatric:        Mood and Affect: Mood normal.        Behavior: Behavior normal.    Review of Systems  Psychiatric/Behavioral:  Positive for depression and suicidal ideas. Negative for substance abuse. The patient is nervous/anxious.   All other systems reviewed and are negative.   Blood pressure 128/63, pulse (!) 107, temperature 98.1 F (36.7 C), temperature source Oral, resp. rate 20, last menstrual period 12/22/2022, SpO2 100 %. There is no height or weight on file to calculate BMI.  Past Psychiatric History: Reports a history of depression anxiety  Is the patient at risk to self? Yes  Has the patient been a risk to self in the past 6 months? Yes .    Has the patient been a risk to self within the distant past? No   Is the patient a risk to others? No   Has the patient been a risk to others in the past 6 months? No   Has the patient been a risk to others within the distant past? No   Past Medical History:     Last Labs:  Office Visit on 12/22/2022  Component Date Value Ref Range Status   WBC 12/22/2022 5.1  3.4 - 10.8 x10E3/uL Final   RBC 12/22/2022 4.80  3.77 - 5.28 x10E6/uL Final   Hemoglobin 12/22/2022 14.0  11.1 - 15.9 g/dL Final   Hematocrit 82/95/6213 43.9  34.0 - 46.6 % Final   MCV 12/22/2022 92  79 - 97 fL Final   MCH 12/22/2022 29.2  26.6 - 33.0 pg Final   MCHC 12/22/2022 31.9  31.5 - 35.7 g/dL Final   RDW 08/65/7846 12.1  11.7 - 15.4 % Final   Platelets 12/22/2022 144 (L)  150 - 450 x10E3/uL Final   Glucose 12/22/2022 99  70 - 99 mg/dL Final   BUN 96/29/5284 10  6 - 20 mg/dL Final   Creatinine, Ser 12/22/2022 0.62  0.57 - 1.00 mg/dL Final   eGFR 13/24/4010 126  >59 mL/min/1.73 Final   BUN/Creatinine Ratio 12/22/2022 16  9 - 23 Final   Sodium 12/22/2022 138  134 - 144 mmol/L Final   Potassium 12/22/2022 3.9  3.5 - 5.2 mmol/L Final   Chloride 12/22/2022 100  96 - 106 mmol/L Final   CO2 12/22/2022 22  20 - 29 mmol/L Final   Calcium 12/22/2022 9.4  8.7 -  10.2 mg/dL Final   Total Protein 27/25/3664 7.0  6.0 - 8.5 g/dL Final   Albumin 40/34/7425 4.8  4.0 - 5.0 g/dL Final   Globulin, Total 12/22/2022 2.2  1.5 - 4.5 g/dL Final   Albumin/Globulin Ratio 12/22/2022 2.2  1.2 - 2.2 Final   Bilirubin Total 12/22/2022 0.5  0.0 - 1.2 mg/dL Final   Alkaline Phosphatase 12/22/2022 56  44 - 121 IU/L Final   AST 12/22/2022 12  0 - 40 IU/L Final   ALT 12/22/2022 16  0 - 32 IU/L Final   Total Iron Binding Capacity 12/22/2022 353  250 - 450 ug/dL Final   UIBC 95/63/8756 218  131 - 425 ug/dL Final   Iron 43/32/9518 135  27 - 159 ug/dL Final   Iron Saturation 12/22/2022 38  15 - 55 % Final   Ferritin 12/22/2022 27  15 - 150 ng/mL Final   Vit D, 25-Hydroxy 12/22/2022 39.3  30.0 - 100.0 ng/mL Final  Comment: Vitamin D deficiency has been defined by the Institute of Medicine and an Endocrine Society practice guideline as a level of serum 25-OH vitamin D less than 20 ng/mL (1,2). The Endocrine Society went on to further define vitamin D insufficiency as a level between 21 and 29 ng/mL (2). 1. IOM (Institute of Medicine). 2010. Dietary reference    intakes for calcium and D. [place]: The    [employer]. 2. Holick MF, Binkley National, Bischoff-Ferrari HA, et al.    Evaluation, treatment, and prevention of vitamin D    deficiency: an Endocrine Society clinical practice    guideline. JCEM. 2011 Jul; 96(7):1911-30.    TSH 12/22/2022 1.600  0.450 - 4.500 uIU/mL Final   T4, Total 12/22/2022 8.0  4.5 - 12.0 ug/dL Final   T3 Uptake Ratio 12/22/2022 27  24 - 39 % Final   Free Thyroxine Index 12/22/2022 2.2  1.2 - 4.9 Final   Preg Test, Ur 12/22/2022 Negative  Negative Final  Admission on 09/21/2022, Discharged on 09/21/2022  Component Date Value Ref Range Status   Color, Urine 09/21/2022 YELLOW  YELLOW Final   APPearance 09/21/2022 CLOUDY (A)  CLEAR Final   Specific Gravity, Urine 09/21/2022 1.017  1.005 - 1.030 Final   pH 09/21/2022 6.0  5.0 - 8.0  Final   Glucose, UA 09/21/2022 NEGATIVE  NEGATIVE mg/dL Final   Hgb urine dipstick 09/21/2022 LARGE (A)  NEGATIVE Final   Bilirubin Urine 09/21/2022 NEGATIVE  NEGATIVE Final   Ketones, ur 09/21/2022 5 (A)  NEGATIVE mg/dL Final   Protein, ur 14/78/2956 100 (A)  NEGATIVE mg/dL Final   Nitrite 21/30/8657 NEGATIVE  NEGATIVE Final   Leukocytes,Ua 09/21/2022 LARGE (A)  NEGATIVE Final   RBC / HPF 09/21/2022 >50  0 - 5 RBC/hpf Final   WBC, UA 09/21/2022 >50  0 - 5 WBC/hpf Final   Bacteria, UA 09/21/2022 MANY (A)  NONE SEEN Final   Squamous Epithelial / HPF 09/21/2022 6-10  0 - 5 /HPF Final   Mucus 09/21/2022 PRESENT   Final   Performed at [facility], [address] [place], [place] [address]   Preg Test, Ur 09/21/2022 NEGATIVE  NEGATIVE Final   Comment:        THE SENSITIVITY OF THIS METHODOLOGY IS >20 mIU/mL. Performed at [facility], [address] [place], [place] [address]     Allergies: Patient has no known allergies.  Medications:  Facility Ordered Medications  Medication   acetaminophen (TYLENOL) tablet 650 mg   alum & mag hydroxide-simeth (MAALOX/MYLANTA) 200-200-20 MG/5ML suspension 30 mL   magnesium hydroxide (MILK OF MAGNESIA) suspension 30 mL   traZODone (DESYREL) tablet 50 mg   ARIPiprazole (ABILIFY) tablet 5 mg   PTA Medications  Medication Sig   cephALEXin (KEFLEX) 500 MG capsule Take 1 capsule (500 mg total) by mouth 4 (four) times daily.   norgestimate-ethinyl estradiol (ORTHO-CYCLEN) 0.25-35 MG-MCG tablet Take 1 tablet by mouth daily.      Medical Decision Making  Inpatient admission -Initiated Abilify 5 mg p.o. daily and trazodone 50 mg p.o. nightly as needed    Recommendations  Based on my evaluation the patient does not appear to have an emergency medical condition.  Oneta Rack, NP 01/01/23  5:40 PM

## 2023-01-01 NOTE — Progress Notes (Signed)
   01/01/23 1659  [facility] Triage Screening (Walk-ins at [facility] only)  How Did You Hear About [place]? Self  What Is the Reason for Your Visit/Call Today? Pt presents to [facility] voluntarily seeking a mental health evaluation. Pt reports increased passive SI, intrusive thoughts, delusional thinking and paranoia. Pt reports her brain is "on a loop" and her brain is telling her things about herself. Pt reports her brain is telling her that she is a serial killer. Pt is fidgety, anxious, and appears disheveled. Pt reports he symptoms began yesterday but she cannot identify a trigger. Pt denies past hospitalizations. Pt denies past suicide attempts.Pt denies any plans or intent to harm herself. Pt denies drug or alcohol use,HI and AVH at this time.  How Long Has This Been Causing You Problems? <Week  Have You Recently Had Any Thoughts About Hurting Yourself? Yes  How long ago did you have thoughts about hurting yourself? today  Are You Planning to Commit Suicide/Harm Yourself At This time? No  Have you Recently Had Thoughts About Hurting Someone Karolee Ohs? No  Are You Planning To Harm Someone At This Time? No  Are you currently experiencing any auditory, visual or other hallucinations? No  Have You Used Any Alcohol or Drugs in the Past 24 Hours? No  Do you have any current medical co-morbidities that require immediate attention? No  Clinician description of patient physical appearance/behavior: anxious, disheveled  What Do You Feel Would Help You the Most Today? Treatment for Depression or other mood problem  If access to [facility] was not available, would you have sought care in the Emergency Department? No  Determination of Need Urgent (48 hours)  Options For Referral Inpatient Hospitalization

## 2023-01-01 NOTE — Progress Notes (Signed)
Patient has been denied by [facility] due to no appropriate beds. Patient meets BH inpatient criteria per Hillery Jacks, NP. Patient has been faxed out to the following facilities:   [facility]  [address] [place] [place] [address] [phone]  [facility]  [address] [place] [address] [phone]  [facility]  [address] [place] [address] [phone]  [facility]  [address] [place] [address] [phone]  [facility]  [address] [place] [address] [phone]  [facility]  [address] [place] [address] [phone]  [facility]  [address] [place], [place] [address] [phone]  [facility]  [address] [place], [place] [address] [phone]  [facility]  [address] [place] [address] [phone]  [facility]  [address] [place] [address] [phone]  [facility]  [address] [place] [address] [phone]  [facility]  [address] [place] [address] [phone]   Damita Dunnings, MSW, LCSW-A  5:58 PM 01/01/2023

## 2023-01-01 NOTE — ED Notes (Signed)
Patient with no sxs of distress at this time - will continue to monitor for safety 
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
## 2023-01-01 NOTE — ED Notes (Signed)
Patient has been brought on the unit, familiarized with unit, medication has been given and she is lying in the bed with eyes closed no distress noted.

## 2023-01-02 MED ORDER — HYDROXYZINE HCL 25 MG PO TABS: 25.0000 mg | ORAL_TABLET | Freq: Every day | ORAL | 0 refills | Status: AC | PRN

## 2023-01-02 NOTE — ED Provider Notes (Signed)
FBC/OBS ASAP Discharge Summary  Date and Time: 01/02/2023 2:01 PM  Name: Stacie Eaton  MRN:  [id number]   Discharge Diagnoses:  Final diagnoses:  Suicidal ideation    Subjective: Upon reassessment, pt reports "I feel better and want to go home."   Pt reports that she has been struggling with ongoing anxiety and negative thoughts for a "while." Reports she always thought she could deal with it without help, but realized last night she needs a psychiatrist and treatment. Reports she is not struggling with the intrusive thoughts and feels "better." She identifies that she has a strong support system at home with her husband. She identifies her husband and children (3 yrs old & 5 yrs old) as her reasons to live. Pt denies SI, HI, or AVH. Identifies a history of self harming several years ago but denies recently. She states this was also a form of releasing emotions not because she wanted to die.   Collateral with Beryle Beams, Husband - Reports that the pt at times gets "panic attacks" and has always been hard on herself. 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 He works long hours and knows she doesn't get much time for self care. He also identifies he believes some of these events Zoanne Newill be coming from them changing her birth control so they are going to talk to her OBGYN. Reports that they have a wonderful relationship and he will support her in any way that he can. Identifies her not having her license as a barrier, but he has informed his job that he Luana Tatro need time to take her to get established in care. Denies any concerns for her harming herself or others, denies any concerns with her coming down. States he will be more than willing to pick her up and that she is his "best friend." Denies access to firearms.   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  She identified intrusive thoughts regarding negative things. She expressed no previous psychiatric treatment but a family history of Bipolar. She maintained in good control throughout her stay.    After discussion with Dr. Viviano Simas, patient not discharged on Abilify and was encouraged to follow up with [facility] outpatient walk in hours for further pharmacological management. Pt was given hydroxyzine 25 mg once daily as needed for anxiety (7 day supply).   Total Time spent with patient: 30 minutes  Past Psychiatric History: None known Past Medical History: None known Family History: None known Family Psychiatric History: Bipolar hx.  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 Supportive husband.  Tobacco Cessation:  N/A, patient does not currently use tobacco products  Current Medications:  Current Facility-Administered Medications  Medication Dose Route Frequency Provider Last Rate Last Admin   acetaminophen (TYLENOL) tablet 650 mg  650 mg Oral Q6H PRN Oneta Rack, NP       alum & mag hydroxide-simeth (MAALOX/MYLANTA) 200-200-20 MG/5ML suspension 30 mL  30 mL Oral Q4H PRN Oneta Rack, NP       magnesium hydroxide (MILK OF MAGNESIA) suspension 30 mL  30 mL Oral Daily PRN Oneta Rack, NP       traZODone (DESYREL) tablet 50 mg  50 mg Oral QHS,MR X 1 Oneta Rack, NP   50 mg at 01/01/23 2253   Current Outpatient Medications  Medication  Sig Dispense Refill   EPINEPHrine 0.3 mg/0.3 mL IJ SOAJ injection Inject 0.3 mg into the muscle as needed.     norgestimate-ethinyl estradiol (ORTHO-CYCLEN) 0.25-35 MG-MCG tablet Take 1 tablet by mouth daily. 28 tablet 11    PTA Medications:  Facility Ordered Medications  Medication   acetaminophen (TYLENOL) tablet 650 mg   alum & mag hydroxide-simeth (MAALOX/MYLANTA) 200-200-20 MG/5ML suspension 30 mL   magnesium hydroxide (MILK OF MAGNESIA) suspension 30 mL   traZODone (DESYREL) tablet 50 mg   [COMPLETED] ARIPiprazole (ABILIFY)  tablet 5 mg   PTA Medications  Medication Sig   EPINEPHrine 0.3 mg/0.3 mL IJ SOAJ injection Inject 0.3 mg into the muscle as needed.   norgestimate-ethinyl estradiol (ORTHO-CYCLEN) 0.25-35 MG-MCG tablet Take 1 tablet by mouth daily.       12/22/2022    8:24 AM  Depression screen PHQ 2/9  Decreased Interest 0  Down, Depressed, Hopeless 0  PHQ - 2 Score 0    Flowsheet Row ED from 01/01/2023 in [facility] ED from 09/21/2022 in [facility] Emergency Department at [facility]  C-SSRS RISK CATEGORY Low Risk No Risk       Musculoskeletal  Strength & Muscle Tone: within normal limits Gait & Station: normal Patient leans: N/A  Psychiatric Specialty Exam  Presentation  General Appearance:  Disheveled (hair unkept, but just woke up)  Eye Contact: Good  Speech: Clear and Coherent; Normal Rate  Speech Volume: Normal  Handedness: Right   Mood and Affect  Mood: Euthymic  Affect: Congruent   Thought Process  Thought Processes: Goal Directed; Linear; Coherent  Descriptions of Associations:Intact  Orientation:Full (Time, Place and Person)  Thought Content:Logical; WDL  Diagnosis of Schizophrenia or Schizoaffective disorder in past: No    Hallucinations:Hallucinations: None  Ideas of Reference:None  Suicidal Thoughts:Suicidal Thoughts: No  Homicidal Thoughts:Homicidal Thoughts: No   Sensorium  Memory: Immediate Good; Recent Good; Remote Good  Judgment: Good  Insight: Fair   Art therapist  Concentration: Fair  Attention Span: Fair  Recall: Good  Fund of Knowledge: Good  Language: Good   Psychomotor Activity  Psychomotor Activity: Psychomotor Activity: Normal   Assets  Assets: Communication Skills; Desire for Improvement; Housing; Social Support   Sleep  Sleep: Sleep: Fair   Nutritional Assessment (For OBS and FBC admissions only) Has the patient had a weight loss or gain of 10  pounds or more in the last 3 months?: No Has the patient had a decrease in food intake/or appetite?: No Does the patient have dental problems?: No Does the patient have eating habits or behaviors that Jovin Fester be indicators of an eating disorder including binging or inducing vomiting?: No Has the patient recently lost weight without trying?: 0 Has the patient been eating poorly because of a decreased appetite?: 0 Malnutrition Screening Tool Score: 0    Physical Exam  Physical Exam Vitals and nursing note reviewed.  Constitutional:      General: She is not in acute distress.    Appearance: She is well-developed.  HENT:     Head: Normocephalic and atraumatic.  Eyes:     Conjunctiva/sclera: Conjunctivae normal.  Cardiovascular:     Rate and Rhythm: Normal rate and regular rhythm.  Pulmonary:     Effort: Pulmonary effort is normal. No respiratory distress.  Abdominal:     Palpations: Abdomen is soft.     Tenderness: There is no abdominal tenderness.  Musculoskeletal:        General: No swelling.  Cervical back: Neck supple.  Skin:    General: Skin is warm and dry.  Neurological:     Mental Status: She is alert.  Psychiatric:        Attention and Perception: Attention normal.        Mood and Affect: Mood normal.        Speech: Speech normal.        Behavior: Behavior normal. Behavior is cooperative.        Thought Content: Thought content normal.        Cognition and Memory: Cognition and memory normal.        Judgment: Judgment normal.    Review of Systems  Constitutional: Negative.   HENT: Negative.    Eyes: Negative.   Respiratory: Negative.    Cardiovascular: Negative.   Gastrointestinal: Negative.   Genitourinary: Negative.   Musculoskeletal: Negative.   Skin: Negative.   Neurological: Negative.   Psychiatric/Behavioral:  Negative for hallucinations, substance abuse and suicidal ideas.    Blood pressure 112/66, pulse 92, temperature 98.2 F (36.8 C),  temperature source Oral, resp. rate 18, last menstrual period 12/22/2022, SpO2 100 %. There is no height or weight on file to calculate BMI.  Demographic Factors:  Caucasian and Unemployed  Loss Factors: NA  Historical Factors: NA  Risk Reduction Factors:   Responsible for children under 69 years of age, Sense of responsibility to family, Living with another person, especially a relative, and Positive social support  Continued Clinical Symptoms:  N/A  Cognitive Features That Contribute To Risk:  None    Suicide Risk:  Minimal: No identifiable suicidal ideation.  Patients presenting with no risk factors but with morbid ruminations; Jannie Doyle be classified as minimal risk based on the severity of the depressive symptoms  Plan Of Care/Follow-up recommendations:  Follow up with [facility] outpatient services during walk in hours. Given Hydroxyzine 25 mg daily as needed for anxiety.   Discharge recommendations:   Medications: Patient is to take medications as prescribed. The patient or patient's guardian is to contact a medical professional and/or outpatient provider to address any new side effects that develop. The patient or the patient's guardian should update outpatient providers of any new medications and/or medication changes.    Outpatient Follow up: Please review list of outpatient resources for psychiatry and counseling. Please follow up with your primary care provider for all medical related needs.    Therapy: We recommend that patient participate in individual therapy to address mental health concerns.   Atypical antipsychotics: If you are prescribed an atypical antipsychotic, it is recommended that your height, weight, BMI, blood pressure, fasting lipid panel, and fasting blood sugar be monitored by your outpatient providers.  Safety:   The following safety precautions should be taken:   No sharp objects. This includes scissors, razors, scrapers, and putty knives.   Chemicals  should be removed and locked up.   Medications should be removed and locked up.   Weapons should be removed and locked up. This includes firearms, knives and instruments that can be used to cause injury.   The patient should abstain from use of illicit substances/drugs and abuse of any medications.  If symptoms worsen or do not continue to improve or if the patient becomes actively suicidal or homicidal then it is recommended that the patient return to the closest hospital emergency department, the [facility], or call 911 for further evaluation and treatment. National Suicide Prevention Lifeline 1-800-SUICIDE or 425-107-7604.  About 988  988 offers 24/7 access to trained crisis counselors who can help people experiencing mental health-related distress. People can call or text 988 or chat 988lifeline.org for themselves or if they are worried about a loved one who Tavone Caesar need crisis support.    Disposition: Discharge Follow up with [facility] outpatient services during walk in hours.  Morrell Fluke, NP 01/02/2023, 1:41 PM

## 2023-01-02 NOTE — ED Notes (Signed)
Patient is resting with no sxs of distress at this time - will continue to monitor for safety

## 2023-01-02 NOTE — Discharge Instructions (Addendum)
Discharge recommendations:   Medications: Patient is to take medications as prescribed. The patient or patient's guardian is to contact a medical professional and/or outpatient provider to address any new side effects that develop. The patient or the patient's guardian should update outpatient providers of any new medications and/or medication changes.    Outpatient Follow up: Please review list of outpatient resources for psychiatry and counseling. Please follow up with your primary care provider for all medical related needs.    Therapy: We recommend that patient participate in individual therapy to address mental health concerns.   Atypical antipsychotics: If you are prescribed an atypical antipsychotic, it is recommended that your height, weight, BMI, blood pressure, fasting lipid panel, and fasting blood sugar be monitored by your outpatient providers.  Safety:   The following safety precautions should be taken:   No sharp objects. This includes scissors, razors, scrapers, and putty knives.   Chemicals should be removed and locked up.   Medications should be removed and locked up.   Weapons should be removed and locked up. This includes firearms, knives and instruments that can be used to cause injury.   The patient should abstain from use of illicit substances/drugs and abuse of any medications.  If symptoms worsen or do not continue to improve or if the patient becomes actively suicidal or homicidal then it is recommended that the patient return to the closest hospital emergency department, the [facility], or call 911 for further evaluation and treatment. National Suicide Prevention Lifeline 1-800-SUICIDE or 548-128-1948.  About 988 988 offers 24/7 access to trained crisis counselors who can help people experiencing mental health-related distress. People can call or text 988 or chat 988lifeline.org for themselves or if they are worried about a  loved one who Stacie Eaton need crisis support.       Outpatient Services for Therapy and Medication Management for Medicaid  Based on what you have shared, a list of resources for outpatient therapy and psychiatry is provided below to get you started back on treatment.  It is imperative that you follow through with treatment within 5-7 days from the day of discharge to prevent any further risk to your safety or mental well-being.  You are not limited to the list provided.  In case of an urgent crisis, you Stacie Eaton contact the Mobile Crisis Unit with [facility] at [phone].  [facility] [address], [place], [address] [phone] phone  New Patient Assessment/Therapy Walk-ins Monday and Wednesday: 8am until slots are full. Every 1st and 2nd Friday: 1pm - 5pm  NO ASSESSMENT/THERAPY WALK-INS ON TUESDAYS OR THURSDAYS  New Patient Psychiatry/Medication Management Walk-ins Monday-Friday: 8am-11am  For all walk-ins, we ask that you arrive by 7:30am because patient will be seen in the order of arrival.  Availability is limited; therefore, you Stacie Eaton not be seen on the same day that you walk-in.  Our goal is to serve and meet the needs of our community to the best of our ability.   [facility] [address] [place], [place], [address] [phone] phone  [facility] [address] [place], [place], [address] [phone] phone [phone] phone ([address], Anthem/Elevance, [address], [address], [address], [address], Healthy [place], [place], [place], [address], [employer]oco[employer]ill[employer], [place], UHC, American [employer], [place], Out of Network)  [employer], [place] [address] [place], [place], [address] [phone] phone ([place], Anthem/Elevance, [employer], [place], [place]  Health Alliance, [place]on, [place], [place], [place], [employer],  [place], [place], [place], [facility])  [employer] [phone] [place], [place], [address] [phone] phone (Medicaid, ask about other insurance)  [facility] [address] [place], [place], [address] [phone] phone [phone] fax ([address] [place] , [place], [place], [place] Health Choice, UHC, General Electric, Self-Pay)  [facility] [address] [place], [place], [address] [phone] phone ([address] Anthem/Elevance, [address], [address], [place], [employer], [place], [place], [place], [employer], [place], [place], [place], [facility])  [facility] - 6-8 MONTH WAIT FOR THERAPY; [facility] FOR MEDICATION MANAGEMENT [address] [place], [place], [address] [phone] phone ([address] AmeriHealth [address] - [place], [address], [place], [place], Friday Health Plans, [address], BCBS Healthy [place], [place], [address], [place], [place], [place], [place], Tricare, UHC, [employer]co Corporation, [place])  [facility] [address], [address] [place], [place], [address] [phone] phone  [facility] [address], [address] [place], [place], [address] [phone] phone  [facility] [address], [address] [place], [place], [address] [phone] phone  [facility] [place] - THERAPY ONLY [address], [place], [address] [phone] phone  [facility] [place], [place] [address], [place], [address] [phone] phone  [facility] [address] [place], [place], [address] [phone] phone [phone] fax  [facility] [address] [place], [place], [address] [phone] phone [phone] fax  [facility] [phone] [address] [place], [place], [address] [phone] phone  Jovita Kussmaul [address] [place], [place], [address]  [phone] phone  [facility]   (Adults Only) [address] [place], [place], [address]  [phone] phone [phone] fax

## 2023-01-02 NOTE — ED Notes (Signed)
Discharge instructions provided and Pt stated understanding. Pt alert, orient and ambulatory prior to d/c from facility. Personal belongings returned from locker number 11. Patient escorted to the lobby to discharge home with her family. Safety maintained.

## 2023-01-02 NOTE — ED Notes (Signed)
Patient denies SI/HI/AVH. Received no scheduled meds this morning. Breakfast provided. Patient currently resting in pull out bed/chair in no observed distress. Safety maintained and will continue to monitor.

## 2023-01-07 DIAGNOSIS — Z419 Encounter for procedure for purposes other than remedying health state, unspecified: Secondary | ICD-10-CM | POA: Diagnosis not present

## 2023-01-11 ENCOUNTER — Ambulatory Visit (INDEPENDENT_AMBULATORY_CARE_PROVIDER_SITE_OTHER): Payer: Medicaid Other | Admitting: Clinical

## 2023-01-11 ENCOUNTER — Encounter (HOSPITAL_COMMUNITY): Payer: Self-pay

## 2023-01-11 DIAGNOSIS — F332 Major depressive disorder, recurrent severe without psychotic features: Secondary | ICD-10-CM

## 2023-01-11 NOTE — Progress Notes (Signed)
THERAPIST PROGRESS NOTE  Session Time: 30 minutes  Participation Level: Active  Behavioral Response: CasualAlertDepressed  Type of Therapy: Individual Therapy  Treatment Goals addressed: client will identify 3 therapy goals for treatment  ProgressTowards Goals: Initial  Interventions: CBT and Supportive  Summary:  Stacie Eaton is a 27 y.o. female who [facility] as a walk- in for outpatient services. Client is referred by the [facility] urgent care for follow up care. Client originally presented voluntarily to the [facility] urgent care on 01/01/2023 due to suicidal ideation, delusional thinking, and paranoia. Client reported at that time her brain was on a "loop" trying to convince her she wanted to bad things she knows she would not want to do. Client reported being triggered by watching a [employer] television show. Client reported she has prior history of depression and anxiety that she had never sought treatment for. Client reported she was given a 7 day prescription of hydroxyzine from [facility] urgent care which was helpful but she has run out. Client reported she has no prior history of official diagnosis by a inpatient and/or outpatient provider for her symptoms.  Client reported as of today she is not suicidal but still very depressed. Client reported her depressive symptoms have been reoccuring for years but just "ramped up" in the past few weeks without a noticeable turning point to trigger her. Client reported the first obvious sign of depression is lack of appetite and caring for herself. Client reported episodically her brain fixating on things such as medical issues she worries about having which she does not have. Client reported a part of her understands that thought process is irrational but it doesn't stop the cycle of intrusive thoughts.  Evidence of progress towards goal:       01/11/2023    1:35 PM  GAD 7 : Generalized Anxiety Score   Nervous, Anxious, on Edge 3  Control/stop worrying 2  Worry too much - different things 2  Trouble relaxing 3  Restless 2  Easily annoyed or irritable 2  Afraid - awful might happen 2  Total GAD 7 Score 16  Anxiety Difficulty Very difficult     Flowsheet Row Counselor from 01/11/2023 in [facility]  PHQ-9 Total Score 20        Suicidal/Homicidal: Nowithout intent/plan  Therapist Response:  Therapist began the appointment discussing confidentiality and making introduction. Therapist used CBT to engage using active listening and positive emotional support. Therapist used CBT to ask the client open ended questions about her mental health history. Therapist used CBT to ask the client about the precedent events before seeking service at the [facility] urgent care. Therapist used CBT to ask the client about current severity of her symptoms and assess for safety. Therapist completed SDOH and treatment plan.  Therapist used CBT ask the client to identify her progress with frequency of use with coping skills with continued practice in her daily activity.    Therapist assigned the client homework to attend her follow up psychiatry and therapy appointments.   Plan: Return again in 3 weeks.  Diagnosis: severe recurrent major depressive disorder, without psychotic features  Collaboration of Care: Patient refused AEB none requested by the client.  Patient/Guardian was advised Release of Information must be obtained prior to any record release in order to collaborate their care with an outside provider. Patient/Guardian was advised if they have not already done so to contact the registration department to sign all  necessary forms in order for [place] to release information regarding their care.   Consent: Patient/Guardian gives verbal consent for treatment and assignment of benefits for services provided during this visit. Patient/Guardian expressed understanding and agreed  to proceed.   Neena Rhymes Joneric Streight, LCSW 01/11/2023

## 2023-01-16 ENCOUNTER — Ambulatory Visit
Admission: RE | Admit: 2023-01-16 | Discharge: 2023-01-16 | Disposition: A | Discharge status: Home / Self Care | Payer: Medicaid Other | Source: Ambulatory Visit | Attending: Nurse Practitioner | Admitting: Nurse Practitioner

## 2023-01-16 VITALS — BP 102/70 | HR 84 | Temp 97.8°F | Resp 18

## 2023-01-16 DIAGNOSIS — R300 Dysuria: Secondary | ICD-10-CM | POA: Diagnosis not present

## 2023-01-16 LAB — POCT URINALYSIS DIP (MANUAL ENTRY)
Bilirubin, UA: NEGATIVE
Glucose, UA: NEGATIVE mg/dL
Ketones, POC UA: NEGATIVE mg/dL
Leukocytes, UA: NEGATIVE
Nitrite, UA: NEGATIVE
Protein Ur, POC: NEGATIVE mg/dL
Spec Grav, UA: 1.01 (ref 1.010–1.025)
Urobilinogen, UA: 0.2 E.U./dL
pH, UA: 7 (ref 5.0–8.0)

## 2023-01-16 LAB — POCT URINE PREGNANCY: Preg Test, Ur: NEGATIVE

## 2023-01-16 NOTE — ED Triage Notes (Signed)
Pt presents with c/o possible UTI, has frequency, feels she has not fully emptied her bladder. Took an at home UTI test and was concerned of results.

## 2023-01-16 NOTE — ED Provider Notes (Signed)
[facility]    CSN: [id number] Arrival date & time: 01/16/23  1544      History   Chief Complaint Chief Complaint  Patient presents with   Urinary Frequency    Entered by patient    HPI Stacie Eaton is a 27 y.o. female presents for evaluation of dysuria.  Patient reports 3 days of urinary burning, urgency, and some abdominal cramping.  Denies burning with urination, hematuria, fevers, flank pain.  Did have 1 episode of vomiting.  She endorses a history of UTIs but denies history of pyelonephritis.  No vaginal discharge or STD concern.  She is taken over-the-counter Cystex with improvement in symptoms.  Patient is on her menses.  No other concerns at this time.   Urinary Frequency    Past Medical History:  Diagnosis Date   Depression    postpartum   Urticaria     Patient Active Problem List   Diagnosis Date Noted   Vitamin D deficiency 12/22/2022   SVD (spontaneous vaginal delivery) 02/20/2019   Gestational thrombocytopenia (HCC) 02/20/2019   Oligohydramnios 02/18/2019   Chronic urticaria 07/13/2016    Past Surgical History:  Procedure Laterality Date   NO PAST SURGERIES      OB History     Gravida  2   Para  2   Term  2   Preterm      AB      Living  2      SAB      IAB      Ectopic      Multiple  0   Live Births  1            Home Medications    Prior to Admission medications   Medication Sig Start Date End Date Taking? Authorizing Provider  EPINEPHrine 0.3 mg/0.3 mL IJ SOAJ injection Inject 0.3 mg into the muscle as needed. 05/23/16   [provider]  norgestimate-ethinyl estradiol (ORTHO-CYCLEN) 0.25-35 MG-MCG tablet Take 1 tablet by mouth daily. 12/22/22   Ivonne Andrew, NP    Family History Family History  Problem Relation Age of Onset   Allergic rhinitis Neg Hx    Angioedema Neg Hx    Asthma Neg Hx    Eczema Neg Hx    Immunodeficiency Neg Hx    Urticaria Neg Hx     Social History Social  History   Tobacco Use   Smoking status: Never   Smokeless tobacco: Never  Vaping Use   Vaping Use: Never used  Substance Use Topics   Alcohol use: Not Currently    Comment: rarely   Drug use: Never     Allergies   Patient has no known allergies.   Review of Systems Review of Systems  Genitourinary:  Positive for frequency and urgency.     Physical Exam Triage Vital Signs ED Triage Vitals  Enc Vitals Group     BP 01/16/23 1619 102/70     Pulse Rate 01/16/23 1619 84     Resp 01/16/23 1619 18     Temp 01/16/23 1619 97.8 F (36.6 C)     Temp Source 01/16/23 1619 Oral     SpO2 01/16/23 1619 98 %     Weight --      Height --      Head Circumference --      Peak Flow --      Pain Score 01/16/23 1618 0     Pain Loc --  Pain Edu? --      Excl. in GC? --    No data found.  Updated Vital Signs BP 102/70 (BP Location: Right Arm)   Pulse 84   Temp 97.8 F (36.6 C) (Oral)   Resp 18   LMP 01/15/2023 (Exact Date)   SpO2 98%   Visual Acuity Right Eye Distance:   Left Eye Distance:   Bilateral Distance:    Right Eye Near:   Left Eye Near:    Bilateral Near:     Physical Exam Vitals and nursing note reviewed.  Constitutional:      Appearance: Normal appearance.  HENT:     Head: Normocephalic and atraumatic.  Eyes:     Pupils: Pupils are equal, round, and reactive to light.  Cardiovascular:     Rate and Rhythm: Normal rate.  Pulmonary:     Effort: Pulmonary effort is normal.  Abdominal:     Tenderness: There is no right CVA tenderness or left CVA tenderness.  Skin:    General: Skin is warm and dry.  Neurological:     General: No focal deficit present.     Mental Status: She is alert and oriented to person, place, and time.  Psychiatric:        Mood and Affect: Mood normal.        Behavior: Behavior normal.      UC Treatments / Results  Labs (all labs ordered are listed, but only abnormal results are displayed) Labs Reviewed  POCT  URINALYSIS DIP (MANUAL ENTRY) - Abnormal; Notable for the following components:      Result Value   Color, UA colorless (*)    Blood, UA large (*)    All other components within normal limits  URINE CULTURE  POCT URINE PREGNANCY    EKG   Radiology No results found.  Procedures Procedures (including critical care time)  Medications Ordered in UC Medications - No data to display  Initial Impression / Assessment and Plan / UC Course  I have reviewed the triage vital signs and the nursing notes.  Pertinent labs & imaging results that were available during my care of the patient were reviewed by me and considered in my medical decision making (see chart for details).     Reviewed exam and symptoms with patient.  No red flags. UA neg UTI, will culture Pt to continue cystex OTC as needed Stay hydrated PCP follow up if symptoms do not improve  ER precautions reviewed  Final Clinical Impressions(s) / UC Diagnoses   Final diagnoses:  Dysuria     Discharge Instructions      The clinic will contact you with results of the urine culture done today if positive Remain hydrated and follow-up with your PCP if your symptoms do not improve Please go to the ER for any worsening symptoms   ED Prescriptions   None    PDMP not reviewed this encounter.   Radford Pax, NP 01/16/23 [phone]

## 2023-01-16 NOTE — Discharge Instructions (Signed)
The clinic will contact you with results of the urine culture done today if positive Remain hydrated and follow-up with your PCP if your symptoms do not improve Please go to the ER for any worsening symptoms

## 2023-01-17 DIAGNOSIS — F339 Major depressive disorder, recurrent, unspecified: Secondary | ICD-10-CM | POA: Diagnosis not present

## 2023-01-17 LAB — URINE CULTURE: Culture: NO GROWTH

## 2023-01-18 ENCOUNTER — Other Ambulatory Visit: Payer: Self-pay

## 2023-01-18 ENCOUNTER — Emergency Department (HOSPITAL_COMMUNITY)
Admission: EM | Admit: 2023-01-18 | Discharge: 2023-01-19 | Disposition: A | Discharge status: Other Acute Inpatient Hospital | Payer: Medicaid Other | Source: Home / Self Care | Attending: Emergency Medicine | Admitting: Emergency Medicine

## 2023-01-18 ENCOUNTER — Encounter (HOSPITAL_COMMUNITY): Payer: Self-pay | Admitting: *Deleted

## 2023-01-18 DIAGNOSIS — F32A Depression, unspecified: Secondary | ICD-10-CM | POA: Insufficient documentation

## 2023-01-18 DIAGNOSIS — Z79899 Other long term (current) drug therapy: Secondary | ICD-10-CM | POA: Insufficient documentation

## 2023-01-18 DIAGNOSIS — F419 Anxiety disorder, unspecified: Secondary | ICD-10-CM | POA: Insufficient documentation

## 2023-01-18 DIAGNOSIS — F29 Unspecified psychosis not due to a substance or known physiological condition: Secondary | ICD-10-CM | POA: Insufficient documentation

## 2023-01-18 DIAGNOSIS — R45851 Suicidal ideations: Secondary | ICD-10-CM | POA: Diagnosis not present

## 2023-01-18 LAB — RAPID URINE DRUG SCREEN, HOSP PERFORMED
Amphetamines: NOT DETECTED
Barbiturates: NOT DETECTED
Benzodiazepines: NOT DETECTED
Cocaine: NOT DETECTED
Opiates: NOT DETECTED
Tetrahydrocannabinol: POSITIVE — AB

## 2023-01-18 LAB — I-STAT BETA HCG BLOOD, ED (MC, WL, AP ONLY): I-stat hCG, quantitative: <5 m[IU]/mL (ref <5)

## 2023-01-18 LAB — CBC
HCT: 44.9 % (ref 36.0–46.0)
Hemoglobin: 14.8 g/dL (ref 12.0–15.0)
MCH: 28.7 pg (ref 26.0–34.0)
MCHC: 33 g/dL (ref 30.0–36.0)
MCV: 87.2 fL (ref 80.0–100.0)
Platelets: 197 10*3/uL (ref 150–400)
RBC: 5.15 MIL/uL — ABNORMAL HIGH (ref 3.87–5.11)
RDW: 11.9 % (ref 11.5–15.5)
WBC: 9.7 10*3/uL (ref 4.0–10.5)
nRBC: 0 % (ref 0.0–0.2)

## 2023-01-18 LAB — COMPREHENSIVE METABOLIC PANEL
ALT: 17 U/L (ref 0–44)
AST: 17 U/L (ref 15–41)
Albumin: 4.8 g/dL (ref 3.5–5.0)
Alkaline Phosphatase: 47 U/L (ref 38–126)
Anion gap: 10 (ref 5–15)
BUN: 11 mg/dL (ref 6–20)
CO2: 22 mmol/L (ref 22–32)
Calcium: 9.6 mg/dL (ref 8.9–10.3)
Chloride: 104 mmol/L (ref 98–111)
Creatinine, Ser: 0.58 mg/dL (ref 0.44–1.00)
GFR, Estimated: >60 mL/min (ref >60)
Glucose, Bld: 89 mg/dL (ref 70–99)
Potassium: 3.6 mmol/L (ref 3.5–5.1)
Sodium: 136 mmol/L (ref 135–145)
Total Bilirubin: 1.4 mg/dL — ABNORMAL HIGH (ref 0.3–1.2)
Total Protein: 7.9 g/dL (ref 6.5–8.1)

## 2023-01-18 LAB — SALICYLATE LEVEL: Salicylate Lvl: <7 mg/dL — ABNORMAL LOW (ref 7.0–30.0)

## 2023-01-18 LAB — ETHANOL: Alcohol, Ethyl (B): <10 mg/dL (ref <10)

## 2023-01-18 LAB — ACETAMINOPHEN LEVEL: Acetaminophen (Tylenol), Serum: <10 ug/mL — ABNORMAL LOW (ref 10–30)

## 2023-01-18 NOTE — BH Assessment (Signed)
@   2019, the IRIS was initiated for [employer]. Marijean Bravo, the IRIS Coordinator, noted that the patient's teleassessment is pending scheduling. The IRIS Coordinator will follow up with the patient's nurse and ED provider to provide further updates on the timing of the patient's evaluation by IRIS.

## 2023-01-18 NOTE — ED Notes (Signed)
Pt to be transferred to [facility] shortly.

## 2023-01-18 NOTE — ED Provider Notes (Signed)
[facility] AT [facility] Provider Note   CSN: [id number] Arrival date & time: 01/18/23  1640     History  Chief Complaint  Patient presents with   Psychiatric Evaluation    Stacie Eaton is a 27 y.o. female.  Who presents emergency department with complaint of suicidal ideation and depression.  Patient reports that just before Memorial Day she had onset of severe anxiety, depression.  She has been having thoughts of harming herself.  She was seen at previously at behavioral health urgent care and [facility] and then discharged.  She has not yet been able to follow-up with a therapist because she still waiting on an appointment and is not currently on any medications.  She has never been hospitalized in the past but states that currently her anxiety and depression are so severe they are debilitating, affecting her ADLs, keeping her from being able to leave the house or take care of her children appropriately. She denies homicidal ideation or audiovisual hallucinations.  She denies alcohol or drug abuse.  HPI     Home Medications Prior to Admission medications   Medication Sig Start Date End Date Taking? Authorizing Provider  EPINEPHrine 0.3 mg/0.3 mL IJ SOAJ injection Inject 0.3 mg into the muscle as needed. 05/23/16   [provider]  norgestimate-ethinyl estradiol (ORTHO-CYCLEN) 0.25-35 MG-MCG tablet Take 1 tablet by mouth daily. 12/22/22   Ivonne Andrew, NP      Allergies    Patient has no known allergies.    Review of Systems   Review of Systems  Physical Exam Updated Vital Signs BP 117/88   Pulse (!) 110   Temp 98.8 F (37.1 C)   Resp 18   Ht 5\' 1"  (1.549 m)   Wt 43.1 kg   LMP 01/15/2023 (Exact Date)   SpO2 98%   BMI 17.95 kg/m  Physical Exam Physical Exam  Nursing note and vitals reviewed. Constitutional: She is oriented to person, place, and time. She appears well-developed and well-nourished. No distress.  HENT:  Head:  Normocephalic and atraumatic.  Eyes: Conjunctivae normal and EOM are normal. Pupils are equal, round, and reactive to light. No scleral icterus.  Neck: Normal range of motion.  Cardiovascular: Normal rate, regular rhythm and normal heart sounds.  Exam reveals no gallop and no friction rub.   No murmur heard. Pulmonary/Chest: Effort normal and breath sounds normal. No respiratory distress.  Abdominal: Soft. Bowel sounds are normal. She exhibits no distension and no mass. There is no tenderness. There is no guarding.  Neurological: She is alert and oriented to person, place, and time.  Skin: Skin is warm and dry. She is not diaphoretic.  Psych: Flat affect. ED Results / Procedures / Treatments   Labs (all labs ordered are listed, but only abnormal results are displayed) Labs Reviewed  COMPREHENSIVE METABOLIC PANEL  ETHANOL  SALICYLATE LEVEL  ACETAMINOPHEN LEVEL  CBC  RAPID URINE DRUG SCREEN, HOSP PERFORMED  I-STAT BETA HCG BLOOD, ED ([facility], [facility], [facility] ONLY)    EKG None  Radiology No results found.  Procedures Procedures    Medications Ordered in ED Medications - No data to display  ED Course/ Medical Decision Making/ A&P                             Medical Decision Making Patient medically clear for psychiatric evaluation.        Final Clinical Impression(s) / ED  Diagnoses Final diagnoses:  None    Rx / DC Orders ED Discharge Orders     None         Arthor Captain, PA-C 01/19/23 1145    Terrilee Files, MD 01/20/23 1039

## 2023-01-18 NOTE — ED Triage Notes (Signed)
The pt is here with si  she was seen at behavorial and kept overnight she has been biting herself and she is very anxious     she was given a 7 days supply of meds but no further follow up  lmp now

## 2023-01-18 NOTE — ED Notes (Signed)
Pt belongings checked and put in locker 6. Cellphone checked w/ security w/ pt. Sticker attached. Pt wanded by security.

## 2023-01-18 NOTE — ED Notes (Signed)
Pt received for care at 1900.  Quietly resting in bed; respirations even and unlabored.  This RN introduced self to pt.  Bed in lowest position, wheels locked.  Pt in view of sitter with necessary precautions maintained.

## 2023-01-19 ENCOUNTER — Inpatient Hospital Stay (HOSPITAL_COMMUNITY)
Admission: AD | Admit: 2023-01-19 | Discharge: 2023-01-24 | DRG: 885 | Disposition: A | Discharge status: Home / Self Care | Payer: Medicaid Other | Source: Intra-hospital | Attending: Psychiatry | Admitting: Psychiatry

## 2023-01-19 ENCOUNTER — Encounter (HOSPITAL_COMMUNITY): Payer: Self-pay | Admitting: Adult Health

## 2023-01-19 DIAGNOSIS — F5002 Anorexia nervosa, binge eating/purging type: Secondary | ICD-10-CM | POA: Diagnosis present

## 2023-01-19 DIAGNOSIS — F333 Major depressive disorder, recurrent, severe with psychotic symptoms: Principal | ICD-10-CM | POA: Diagnosis present

## 2023-01-19 DIAGNOSIS — Z79899 Other long term (current) drug therapy: Secondary | ICD-10-CM | POA: Diagnosis not present

## 2023-01-19 DIAGNOSIS — Z6372 Alcoholism and drug addiction in family: Secondary | ICD-10-CM | POA: Diagnosis not present

## 2023-01-19 DIAGNOSIS — Z681 Body mass index (BMI) 19 or less, adult: Secondary | ICD-10-CM

## 2023-01-19 DIAGNOSIS — Z811 Family history of alcohol abuse and dependence: Secondary | ICD-10-CM

## 2023-01-19 DIAGNOSIS — R45851 Suicidal ideations: Secondary | ICD-10-CM | POA: Diagnosis not present

## 2023-01-19 DIAGNOSIS — F339 Major depressive disorder, recurrent, unspecified: Secondary | ICD-10-CM | POA: Diagnosis not present

## 2023-01-19 DIAGNOSIS — F411 Generalized anxiety disorder: Secondary | ICD-10-CM | POA: Diagnosis present

## 2023-01-19 DIAGNOSIS — E559 Vitamin D deficiency, unspecified: Secondary | ICD-10-CM | POA: Diagnosis present

## 2023-01-19 DIAGNOSIS — Z560 Unemployment, unspecified: Secondary | ICD-10-CM | POA: Diagnosis not present

## 2023-01-19 HISTORY — DX: Bulimia nervosa: F50.2

## 2023-01-19 HISTORY — DX: Anorexia: R63.0

## 2023-01-19 HISTORY — DX: Anxiety disorder, unspecified: F41.9

## 2023-01-19 HISTORY — DX: Bulimia nervosa, unspecified: F50.20

## 2023-01-19 MED ORDER — LORAZEPAM 1 MG PO TABS: 2.0000 mg | ORAL_TABLET | Freq: Three times a day (TID) | ORAL | Status: DC | PRN

## 2023-01-19 MED ORDER — MAGNESIUM HYDROXIDE 400 MG/5ML PO SUSP: 30.0000 mL | Freq: Every day | ORAL | Status: DC | PRN

## 2023-01-19 MED ORDER — HALOPERIDOL 5 MG PO TABS: 5.0000 mg | ORAL_TABLET | Freq: Three times a day (TID) | ORAL | Status: DC | PRN

## 2023-01-19 MED ORDER — OLANZAPINE 2.5 MG PO TABS
2.5000 mg | ORAL_TABLET | Freq: Every day | ORAL | Status: DC
  Administered 2023-01-19: 2.5 mg via ORAL
  Filled 2023-01-19 (×4): qty 1

## 2023-01-19 MED ORDER — HALOPERIDOL LACTATE 5 MG/ML IJ SOLN: 5.0000 mg | Freq: Three times a day (TID) | INTRAMUSCULAR | Status: DC | PRN

## 2023-01-19 MED ORDER — LORAZEPAM 2 MG/ML IJ SOLN: 2.0000 mg | Freq: Three times a day (TID) | INTRAMUSCULAR | Status: DC | PRN

## 2023-01-19 MED ORDER — ACETAMINOPHEN 325 MG PO TABS: 650.0000 mg | ORAL_TABLET | Freq: Four times a day (QID) | ORAL | Status: DC | PRN

## 2023-01-19 MED ORDER — HYDROXYZINE HCL 25 MG PO TABS: 25.0000 mg | ORAL_TABLET | Freq: Three times a day (TID) | ORAL | Status: DC | PRN

## 2023-01-19 MED ORDER — ALUM & MAG HYDROXIDE-SIMETH 200-200-20 MG/5ML PO SUSP: 30.0000 mL | ORAL | Status: DC | PRN

## 2023-01-19 MED ORDER — DIPHENHYDRAMINE HCL 25 MG PO CAPS: 50.0000 mg | ORAL_CAPSULE | Freq: Three times a day (TID) | ORAL | Status: DC | PRN

## 2023-01-19 MED ORDER — OLANZAPINE 2.5 MG PO TABS: 2.5000 mg | ORAL_TABLET | Freq: Every day | ORAL | Status: DC

## 2023-01-19 MED ORDER — NORGESTIMATE-ETH ESTRADIOL 0.25-35 MG-MCG PO TABS
1.0000 | ORAL_TABLET | Freq: Every day | ORAL | Status: DC
  Administered 2023-01-21 – 2023-01-24 (×4): 1 via ORAL

## 2023-01-19 MED ORDER — DIPHENHYDRAMINE HCL 50 MG/ML IJ SOLN: 50.0000 mg | Freq: Three times a day (TID) | INTRAMUSCULAR | Status: DC | PRN

## 2023-01-19 MED ORDER — HYDROXYZINE HCL 25 MG PO TABS
25.0000 mg | ORAL_TABLET | Freq: Three times a day (TID) | ORAL | Status: DC | PRN
  Administered 2023-01-20 – 2023-01-21 (×2): 25 mg via ORAL
  Filled 2023-01-19 (×2): qty 1

## 2023-01-19 NOTE — ED Notes (Signed)
Attempted to call report to [facility] x 1. Callback number and name left w/ staff for RN to call me back.

## 2023-01-19 NOTE — Progress Notes (Signed)
Pt was accepted to [facility] TODAY 01/19/2023. Bed assignment: 307-1  Pt meets inpatient criteria per Ophelia Shoulder, NP  Attending Physician will be Phineas Inches, MD  Report can be called to: - Adult unit: [phone]  Pt can arrive after 1 PM  Care Team Notified: [facility], RN, Denton Ar, RN, Ophelia Shoulder, NP, and Jacalyn Lefevre, MD  Cathie Beams, LCSW  01/19/2023 11:42 AM

## 2023-01-19 NOTE — ED Notes (Signed)
Transport is here. Pt VS being obtained at this time.

## 2023-01-19 NOTE — Consult Note (Addendum)
Interim Progress Note 01/19/2023: Stacie Eaton, 27 y.o., female patient who presented to  the emergency department with suicidal ideations and self harming behaviors.  At the time of assessment patient did not have a plan for end her life but she endorsed some intrusive thoughts and psychotic features warranting referral for inpatient psychiatric admission.    Patient seen via telepsych by this provider; chart reviewed and consulted with Dr. Lucianne Muss on 01/19/23.  On evaluation Stacie Eaton is seen sitting on the bed, she is alert and oriented x4; appears anxious but no apparent distress. Anticipatory guidance provided, she agrees to discuss medications.    Patient continues to endorse SI with no plan.  Reports being psychotropic medication naive but requesting to start medications to target depressive symptoms, intrusive thoughts and "thoughts that I am a sex offender or killer which I know I am not."  Patient also reports impaired sleep, has been sleeping 3 hours of less over the past few weeks but unsure why.  Appetite is poor, BMI is 17.95.  Patient endorses marijuana use and occasionally alcohol usage.  She denies nicotine or vaping or other illicit drug use.  She denies allergies to medications or food.  She reports a significant family hx for bipolar disorder, including first degree relative, "mom, aunts, they all have it." Patient denies dx and denies manic episodes, decreased need for sleep.    Labs: EKG completed 01/01/2023 QT/QTC interval [id number] Negative pregnancy test UDS positive for marijuana BAL is negative CMP is within normal limits CBC -no leukocytosis  During evaluation Stacie Eaton is siting with HOB elevated; She is alert/oriented x 4; appears sad, anxious but cooperative; and mood congruent with affect.  Patient is speaking in a clear tone at moderate volume, and normal pace; with good eye contact.  Her thought process is goal oriented; thought content is of rumination.  There is  no indication that she is currently responding to internal/external stimuli. Patient endorses suicidal thoughts and engaged in biting herself prior to admission.  She denies homicidal ideation.  Patient has remained anxious but cooperative throughout assessment and has answered questions appropriately.   Recommendations: Based on above, patient with depressive concerns with psychotic features, intrusive thoughts.  She appears to be tormented by intrusive thoughts which contributes to self harm and suicidal ideation.  She continues to meet criteria for inpatient psychiatric admission, which she voluntarily accepts today.  Medication was discussed with patient concordance (potential side effects, goal of therapy); based on our discussion regarding her target symptoms, will recommend starting the following medications: Olanzapine 2.5mg  po qhs for depression/psychotic thoughts Hydroxyzine 25mg  po TID prn anxiety  Disposition: Recommend psychiatric Inpatient admission when medically cleared. Pt accepted to [facility].   Spoke with Dr. Con Memos, ED Provider; Denton Ar, NP; [facility], [facility] and [place], [place] for dispositions; were all informed of above recommendation and disposition via secure chat.   This service was provided via telemedicine using a 2-way, interactive audio and video technology.   Names of all persons participating in this telemedicine service and their role in this encounter.  Name: Stacie Eaton Role: Patient  Name: Nelly Rout Role: Psychaitrist  Name: Ophelia Shoulder Role: PMHNP     Ophelia Shoulder, PMHNP

## 2023-01-19 NOTE — ED Notes (Signed)
TTS in process 

## 2023-01-19 NOTE — BHH Group Notes (Signed)
Adult Psychoeducational Group Note  Date:  01/19/2023 Time:  9:25 PM  Group Topic/Focus:  Wrap-Up Group:   The focus of this group is to help patients review their daily goal of treatment and discuss progress on daily workbooks.  Participation Level:  Active  Participation Quality:  Attentive  Affect:  Appropriate  Cognitive:  Alert  Insight: Appropriate  Engagement in Group:  Engaged  Modes of Intervention:  Discussion  Additional Comments:  Patient attended and participated in the Wrap-up group.  Jearl Klinefelter 01/19/2023, 9:25 PM

## 2023-01-19 NOTE — ED Notes (Signed)
Notified Diplomatic Services operational officer of need for transport to be called to transfer pt.

## 2023-01-19 NOTE — ED Notes (Signed)
Notified secretary to call safe transport 
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
## 2023-01-19 NOTE — Plan of Care (Signed)
°  Problem: Education: °Goal: Emotional status will improve °Outcome: Progressing °Goal: Mental status will improve °Outcome: Progressing °Goal: Verbalization of understanding the information provided will improve °Outcome: Progressing °  °

## 2023-01-19 NOTE — ED Notes (Signed)
All belongings obtained from locker and security and ready for transport

## 2023-01-19 NOTE — Plan of Care (Signed)
Pt alert and oriented x 4, ambulatory and cooperative at time of admission at approx. 1505 pm. Pt states she is here related to thoughts to harm self via biting self and throwing up. Pt also reports recently making herself throw up to loose weight and a history of purposefully not eating much since teenage years. Pt additionally reports feeling like "My brain wants to convince me I'm a monster." Pt states husband is a positive social support source and states she lives with him as well as her 2 children.

## 2023-01-19 NOTE — ED Notes (Signed)
Pt ambulated to safe transport and belongings and paperwork given to safe transport driver. Pt ambulatory w/ steady gait and VSS.

## 2023-01-19 NOTE — Progress Notes (Signed)
   01/19/23 1950  Psych Admission Type (Psych Patients Only)  Admission Status Voluntary  Psychosocial Assessment  Patient Complaints Anxiety  Eye Contact Brief  Facial Expression Flat  Affect Anxious;Depressed  Speech Logical/coherent  Interaction Minimal  Motor Activity Other (Comment) (WDL)  Appearance/Hygiene In scrubs  Behavior Characteristics Cooperative;Anxious  Mood Anxious;Depressed  Thought Process  Coherency WDL  Content WDL  Delusions None reported or observed  Perception WDL  Hallucination None reported or observed  Judgment Poor  Confusion None  Danger to Self  Current suicidal ideation? Denies  Self-Injurious Behavior No self-injurious ideation or behavior indicators observed or expressed   Agreement Not to Harm Self Yes  Description of Agreement Verbal  Danger to Others  Danger to Others None reported or observed

## 2023-01-19 NOTE — Tx Team (Signed)
Initial Treatment Plan 01/19/2023 4:48 PM [name] ZOX:[id number]    PATIENT STRESSORS: Other: anxiety reported by pt      PATIENT STRENGTHS: Communication skills  General fund of knowledge  Motivation for treatment/growth  Supportive family/friends    PATIENT IDENTIFIED PROBLEMS:                      DISCHARGE CRITERIA:  Improved stabilization in mood, thinking, and/or behavior Motivation to continue treatment in a less acute level of care Need for constant or close observation no longer present Verbal commitment to aftercare and medication compliance  PRELIMINARY DISCHARGE PLAN: Outpatient therapy Return to previous living arrangement  PATIENT/FAMILY INVOLVEMENT: This treatment plan has been presented to and reviewed with the patient, Stacie Eaton, .  The patient and family have been given the opportunity to ask questions and make suggestions.  Laurance Flatten, RN 01/19/2023, 4:48 PM

## 2023-01-19 NOTE — ED Provider Notes (Signed)
Emergency Medicine Observation Re-evaluation Note  Lovey Pergola is a 27 y.o. female, seen on rounds today.  Pt initially presented to the ED for complaints of Psychiatric Evaluation Currently, the patient is awake and alert.  She still feels very depressed and has little appetite.  Physical Exam  BP 117/88   Pulse (!) 110   Temp 98.8 F (37.1 C)   Resp 18   Ht 5\' 1"  (1.549 m)   Wt 43.1 kg   LMP 01/15/2023 (Exact Date)   SpO2 98%   BMI 17.95 kg/m  Physical Exam General: awake and alert Cardiac: rr Lungs: clear Psych: depressed  ED Course / MDM  EKG:   I have reviewed the labs performed to date as well as medications administered while in observation.  Recent changes in the last 24 hours include BH eval.  Plan  Current plan is for inpatient psych placement.    Jacalyn Lefevre, MD 01/19/23 [phone]

## 2023-01-20 ENCOUNTER — Encounter (HOSPITAL_COMMUNITY): Payer: Self-pay

## 2023-01-20 DIAGNOSIS — F333 Major depressive disorder, recurrent, severe with psychotic symptoms: Secondary | ICD-10-CM

## 2023-01-20 MED ORDER — OLANZAPINE 5 MG PO TABS
5.0000 mg | ORAL_TABLET | Freq: Every day | ORAL | Status: DC
  Administered 2023-01-20 – 2023-01-23 (×4): 5 mg via ORAL
  Filled 2023-01-20 (×6): qty 1

## 2023-01-20 MED ORDER — NICOTINE 21 MG/24HR TD PT24
21.0000 mg | MEDICATED_PATCH | TRANSDERMAL | Status: DC
  Filled 2023-01-20 (×6): qty 1

## 2023-01-20 MED ORDER — FLUOXETINE HCL 20 MG PO CAPS
20.0000 mg | ORAL_CAPSULE | Freq: Every day | ORAL | Status: DC
  Administered 2023-01-20 – 2023-01-24 (×5): 20 mg via ORAL
  Filled 2023-01-20 (×8): qty 1

## 2023-01-20 MED ORDER — ENSURE ENLIVE PO LIQD
237.0000 mL | Freq: Two times a day (BID) | ORAL | Status: DC
  Administered 2023-01-20 – 2023-01-24 (×3): 237 mL via ORAL
  Filled 2023-01-20 (×12): qty 237

## 2023-01-20 MED ORDER — PROPRANOLOL HCL 10 MG PO TABS
10.0000 mg | ORAL_TABLET | Freq: Two times a day (BID) | ORAL | Status: DC
  Administered 2023-01-20 – 2023-01-24 (×7): 10 mg via ORAL
  Filled 2023-01-20 (×12): qty 1

## 2023-01-20 MED ORDER — ESCITALOPRAM OXALATE 10 MG PO TABS
10.0000 mg | ORAL_TABLET | Freq: Every day | ORAL | Status: DC
  Filled 2023-01-20 (×2): qty 1

## 2023-01-20 MED ORDER — TRAZODONE HCL 50 MG PO TABS
50.0000 mg | ORAL_TABLET | Freq: Every day | ORAL | Status: DC
  Filled 2023-01-20: qty 1

## 2023-01-20 MED ORDER — TRAZODONE HCL 50 MG PO TABS
50.0000 mg | ORAL_TABLET | Freq: Every evening | ORAL | Status: DC | PRN
  Administered 2023-01-20: 50 mg via ORAL
  Filled 2023-01-20: qty 1

## 2023-01-20 NOTE — Group Note (Signed)
Date:  01/20/2023 Time:  10:17 AM  Group Topic/Focus:  Wellness Toolbox:   The focus of this group is to discuss various aspects of wellness, balancing those aspects and exploring ways to increase the ability to experience wellness.  Patients will create a wellness toolbox for use upon discharge.    Participation Level:  Active  Participation Quality:  Appropriate  Affect:  Appropriate  Cognitive:  Appropriate  Insight: Appropriate  Engagement in Group:  Engaged  Modes of Intervention:  Exploration  Additional Comments:       Reymundo Poll 01/20/2023, 10:17 AM
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
## 2023-01-20 NOTE — [facility] Group Notes (Signed)
Chaplain received a consult that Stacie Eaton wanted to create advance directives and assign a HCPOA. She shared that she wanted to assign her husband.  Chaplain explained that he would be considered her next of kin anyway.  She was comforted by this and did not feel the need to fill out the paperwork.    [address], Bcc Pager, [phone]

## 2023-01-20 NOTE — Progress Notes (Signed)
   01/20/23 1610  15 Minute Checks  Location Dayroom  Visual Appearance Calm  Behavior Composed  Sleep (Behavioral Health Patients Only)  Calculate sleep? (Click Yes once per 24 hr at 0600 safety check) Yes  Documented sleep last 24 hours 7.5
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
## 2023-01-20 NOTE — Progress Notes (Signed)
The patient attended the evening A.A.meeting and was appropriate.  

## 2023-01-20 NOTE — Group Note (Signed)
Date:  01/20/2023 Time:  9:40 AM  Group Topic/Focus:  Orientation:   The focus of this group is to educate the patient on the purpose and policies of crisis stabilization and provide a format to answer questions about their admission.  The group details unit policies and expectations of patients while admitted.    Participation Level:  Active  Participation Quality:  Appropriate  Affect:  Appropriate  Cognitive:  Appropriate  Insight: Appropriate  Engagement in Group:  Engaged  Modes of Intervention:  Discussion  Additional Comments:     Reymundo Poll 01/20/2023, 9:40 AM

## 2023-01-20 NOTE — Progress Notes (Signed)
   01/20/23 0900  Psych Admission Type (Psych Patients Only)  Admission Status Voluntary  Psychosocial Assessment  Patient Complaints Anxiety;Depression  Eye Contact Fair  Facial Expression Flat  Affect Anxious;Depressed  Speech Logical/coherent  Interaction Minimal  Motor Activity Other (Comment) (WDL)  Appearance/Hygiene In scrubs  Behavior Characteristics Cooperative;Anxious  Mood Anxious;Depressed  Thought Process  Coherency WDL  Content WDL  Delusions None reported or observed  Perception WDL  Hallucination None reported or observed  Judgment Limited  Confusion None  Danger to Self  Current suicidal ideation? Denies  Self-Injurious Behavior No self-injurious ideation or behavior indicators observed or expressed   Agreement Not to Harm Self Yes  Description of Agreement verbal  Danger to Others  Danger to Others None reported or observed

## 2023-01-20 NOTE — BH IP Treatment Plan (Signed)
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)   Current Medications:  Current Facility-Administered Medications  Medication Dose Route Frequency Provider Last Rate Last Admin   acetaminophen (TYLENOL) tablet 650 mg  650 mg Oral Q6H PRN Massengill, Harrold Donath, MD       alum & mag hydroxide-simeth (MAALOX/MYLANTA) 200-200-20 MG/5ML suspension 30 mL  30 mL Oral Q4H PRN Massengill, Harrold Donath, MD       diphenhydrAMINE (BENADRYL) capsule 50 mg  50 mg Oral TID PRN Phineas Inches, MD       Or   diphenhydrAMINE (BENADRYL) injection 50 mg  50 mg Intramuscular TID PRN Massengill, Harrold Donath, MD       haloperidol (HALDOL) tablet 5 mg  5 mg Oral TID PRN Phineas Inches, MD       Or   haloperidol lactate (HALDOL) injection 5 mg  5 mg Intramuscular TID PRN Massengill, Harrold Donath, MD       hydrOXYzine (ATARAX) tablet 25 mg  25 mg Oral TID PRN Phineas Inches, MD       LORazepam (ATIVAN) tablet 2 mg  2 mg Oral TID PRN Phineas Inches, MD       Or   LORazepam (ATIVAN) injection 2 mg  2 mg Intramuscular TID PRN Massengill, Harrold Donath, MD       magnesium hydroxide (MILK OF MAGNESIA) suspension 30 mL  30 mL Oral Daily PRN Massengill, Harrold Donath, MD       norgestimate-ethinyl estradiol (ORTHO-CYCLEN) 0.25-35 MG-MCG tablet 1 tablet  1 tablet Oral Daily Massengill, Nathan, MD       OLANZapine (ZYPREXA) tablet 2.5 mg  2.5 mg Oral QHS Massengill, Harrold Donath, MD   2.5 mg at 01/19/23 2104   PTA Medications: Medications Prior to Admission  Medication Sig Dispense Refill Last Dose   EPINEPHrine 0.3 mg/0.3 mL IJ SOAJ injection Inject 0.3 mg into the muscle as needed.      norgestimate-ethinyl estradiol (ORTHO-CYCLEN)  0.25-35 MG-MCG tablet Take 1 tablet by mouth daily. 28 tablet 11     Patient Stressors: Other: anxiety reported by pt     Patient Strengths: Forensic psychologist fund of knowledge  Motivation for treatment/growth  Supportive family/friends   Treatment Modalities: Medication Management, Group therapy, Case management,  1 to 1 session with clinician, Psychoeducation, Recreational therapy.   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) Long Term Goal(s): Improvement in symptoms so as ready for discharge   Short Term Goals: Ability to identify changes in lifestyle to reduce recurrence of condition will improve Ability to verbalize feelings will improve Ability to disclose and discuss suicidal ideas Ability to demonstrate self-control will improve Ability to identify and develop effective coping behaviors will improve Ability to maintain clinical measurements within normal limits will improve Compliance with prescribed medications will improve Ability to identify triggers associated with substance abuse/mental health issues will improve  Medication Management: Evaluate patient's response, side effects, and tolerance of medication regimen.  Therapeutic Interventions: 1 to 1 sessions, Unit Group sessions and Medication administration.  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)  Long Term Goal(s): Improvement in symptoms so as ready for discharge  Short Term Goals: Ability to identify changes in lifestyle to reduce recurrence of condition will improve Ability to verbalize feelings will improve Ability to disclose and discuss suicidal ideas Ability to demonstrate self-control will improve Ability to identify and develop  effective coping behaviors will improve Ability to maintain clinical measurements within normal limits will improve Compliance with prescribed medications will improve Ability to identify triggers associated with substance abuse/mental health issues will improve     Medication Management: Evaluate patient's response, side effects, and tolerance of medication regimen.  Therapeutic Interventions: 1 to 1 sessions, Unit Group sessions and Medication administration.  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) Long Term Goal(s): Knowledge of disease and therapeutic regimen to maintain health will improve  Short Term Goals: Ability to remain free from injury will improve, Ability to verbalize frustration and anger appropriately will improve, Ability to demonstrate self-control, Ability to participate in decision making will improve, Ability to verbalize feelings will improve, Ability to disclose and discuss suicidal ideas, Ability to identify and develop effective coping behaviors will improve, and Compliance with prescribed medications will improve  Medication Management: RN will administer medications as ordered by provider, will assess and evaluate patient's response and provide education to patient for prescribed medication. RN will report any adverse and/or side effects to prescribing provider.  Therapeutic Interventions: 1 on 1 counseling sessions, Psychoeducation, Medication administration, Evaluate responses to treatment, Monitor vital signs and CBGs as ordered, Perform/monitor CIWA, COWS, AIMS and Fall Risk screenings as ordered, Perform wound care treatments as ordered.  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  Short Term Goals: Engage patient in aftercare planning with referrals and resources, Increase social support, Increase ability to appropriately verbalize feelings, Increase emotional regulation, Facilitate acceptance of mental health diagnosis and concerns, Facilitate patient progression through stages of change regarding substance use diagnoses and concerns, Identify triggers associated with mental health/substance abuse issues, and Increase skills for wellness and recovery  Therapeutic Interventions: Assess for all discharge needs, 1 to 1 time with Social worker, Explore available resources and support systems, Assess for adequacy in community support network, Educate family and significant other(s) on suicide prevention, Complete Psychosocial Assessment, Interpersonal group therapy.  Evaluation of Outcomes: Progressing   Progress in Treatment: Attending groups: Yes. Participating in groups: Yes. Taking medication as prescribed: Yes. Toleration medication: Yes. Family/Significant other contact made: No, will contact:    Patient understands diagnosis: Yes. Discussing patient identified problems/goals with staff: Yes. Medical problems stabilized or resolved: Yes. Denies suicidal/homicidal ideation: Yes. Issues/concerns per patient self-inventory: Yes. Other: N/A  New problem(s) identified: No, Describe:  None Known  New Short Term/Long Term Goal(s): medication stabilization, elimination of SI thoughts, development of comprehensive mental wellness plan.      Patient Goals:  Medication Stabilization  Discharge Plan or Barriers: : Patient recently admitted. CSW will continue to follow and assess for appropriate referrals and possible discharge planning.      Reason for Continuation of Hospitalization: Anxiety Depression Hallucinations Medication  stabilization Suicidal ideation  Estimated Length of Stay: 3-7 Days  Last 3 [place] Suicide Severity Risk Score: Flowsheet Row Ad[facility] (Current) [facility]18[facility]23 in [facility]  Emergency Department at [facility] ED from [facility]/16/2023 in [facility]  C-SSRS RISK CATEGORY Low Risk High Risk No Risk       Last PHQ 2/9 Scores:    01/11/2023    1:35 PM 12/22/2022    8:24 AM  Depression screen PHQ 2/9  Decreased Interest 3 0  Down, Depressed, Hopeless 3 0  PHQ - 2 Score 6 0  Altered sleeping 3   Tired, decreased energy 2   Change in appetite 3   Feeling bad or failure about yourself  2   Trouble concentrating 2   Moving slowly or fidgety/restless 2   Suicidal thoughts 0   PHQ-9 Score 20   Difficult doing work/chores Very difficult      medication stabilization, elimination of SI thoughts, development of comprehensive mental wellness plan.   Scribe for Treatment Team: Ane Payment, LCSW 01/20/2023 1:40 PM

## 2023-01-21 DIAGNOSIS — F333 Major depressive disorder, recurrent, severe with psychotic symptoms: Secondary | ICD-10-CM | POA: Diagnosis not present

## 2023-01-21 DIAGNOSIS — Z79899 Other long term (current) drug therapy: Secondary | ICD-10-CM | POA: Diagnosis not present

## 2023-01-21 DIAGNOSIS — Z811 Family history of alcohol abuse and dependence: Secondary | ICD-10-CM | POA: Diagnosis not present

## 2023-01-21 DIAGNOSIS — Z681 Body mass index (BMI) 19 or less, adult: Secondary | ICD-10-CM | POA: Diagnosis not present

## 2023-01-21 DIAGNOSIS — F339 Major depressive disorder, recurrent, unspecified: Secondary | ICD-10-CM | POA: Diagnosis not present

## 2023-01-21 DIAGNOSIS — E559 Vitamin D deficiency, unspecified: Secondary | ICD-10-CM | POA: Diagnosis not present

## 2023-01-21 DIAGNOSIS — F50029 Anorexia nervosa, binge eating/purging type, unspecified: Secondary | ICD-10-CM | POA: Insufficient documentation

## 2023-01-21 DIAGNOSIS — Z6372 Alcoholism and drug addiction in family: Secondary | ICD-10-CM | POA: Diagnosis not present

## 2023-01-21 DIAGNOSIS — F5002 Anorexia nervosa, binge eating/purging type: Secondary | ICD-10-CM | POA: Insufficient documentation

## 2023-01-21 DIAGNOSIS — Z560 Unemployment, unspecified: Secondary | ICD-10-CM | POA: Diagnosis not present

## 2023-01-21 DIAGNOSIS — R45851 Suicidal ideations: Secondary | ICD-10-CM | POA: Diagnosis not present

## 2023-01-21 DIAGNOSIS — F411 Generalized anxiety disorder: Secondary | ICD-10-CM | POA: Diagnosis not present

## 2023-01-21 NOTE — Progress Notes (Signed)
   01/21/23 0600  15 Minute Checks  Location Bedroom  Visual Appearance Calm  Behavior Sleeping  Sleep (Behavioral Health Patients Only)  Calculate sleep? (Click Yes once per 24 hr at 0600 safety check) Yes  Documented sleep last 24 hours 7.75

## 2023-01-21 NOTE — Progress Notes (Addendum)
   01/20/23 2150  Psych Admission Type (Psych Patients Only)  Admission Status Voluntary  Psychosocial Assessment  Patient Complaints Anxiety  Eye Contact Fair  Facial Expression Flat  Affect Anxious;Depressed  Speech Logical/coherent  Interaction Assertive  Motor Activity Other (Comment) (WNL)  Appearance/Hygiene Unremarkable  Behavior Characteristics Cooperative;Anxious  Mood Depressed;Anxious;Pleasant  Thought Process  Coherency WDL  Content WDL  Delusions None reported or observed  Perception WDL  Hallucination None reported or observed  Judgment Impaired  Confusion None  Danger to Self  Current suicidal ideation? Denies  Self-Injurious Behavior No self-injurious ideation or behavior indicators observed or expressed   Agreement Not to Harm Self Yes  Description of Agreement Verbal contract for safety  Danger to Others  Danger to Others None reported or observed   Pt reports 4/10 anxiety and 2/10 depression. Pt is pleasant and cooperative. Pt was offered support and encouragement. Pt was given scheduled medications. Given PRN Hydroxyzine and Trazodone per MAR. Q 15 minute checks were done for safety. Pt attended group and interacts well with peers and staff. Pt has no complaints.Pt receptive to treatment and safety maintained on unit.

## 2023-01-21 NOTE — Progress Notes (Signed)
   01/21/23 2248  Psych Admission Type (Psych Patients Only)  Admission Status Voluntary  Psychosocial Assessment  Patient Complaints Anxiety  Eye Contact Fair  Facial Expression Animated  Affect Appropriate to circumstance  Speech Logical/coherent  Interaction Assertive  Motor Activity Other (Comment) (WDL)  Appearance/Hygiene Unremarkable  Behavior Characteristics Appropriate to situation  Mood Anxious;Pleasant  Thought Process  Coherency WDL  Content WDL  Delusions None reported or observed  Perception WDL  Hallucination None reported or observed  Judgment Impaired  Confusion None  Danger to Self  Current suicidal ideation? Denies  Self-Injurious Behavior No self-injurious ideation or behavior indicators observed or expressed   Agreement Not to Harm Self Yes  Description of Agreement verbal  Danger to Others  Danger to Others None reported or observed  Danger to Others Abnormal  Harmful Behavior to others No threats or harm toward other people  Destructive Behavior No threats or harm toward property

## 2023-01-21 NOTE — Group Note (Signed)
Date:  01/21/2023 Time:  9:17 PM  Group Topic/Focus:  Wrap-Up Group:   The focus of this group is to help patients review their daily goal of treatment and discuss progress on daily workbooks.    Participation Level:  Active  Participation Quality:  Appropriate  Affect:  Appropriate  Cognitive:  Appropriate  Insight: Appropriate  Engagement in Group:  Engaged  Modes of Intervention:  Education and Exploration  Additional Comments:  Patient attended and participated in group tonight. She reports that the significant thing that happen to her today was getting to know everyone.  Stacie Eaton [facility] 01/21/2023, 9:17 PM

## 2023-01-21 NOTE — Progress Notes (Addendum)
Pt denied SI/HI/AVH this morning. Pt presents with a depressed/ flat affect. Pt rated her depression a 1/10, anxiety a 4/10, and feelings of hopelessness a 0/10. Pt has been isolative to her room for most of the day. Pt has been cooperative throughout the shift. Pt locked out of room for meals and 1 hour following meals per MD order. RN provided support and encouragement to patient. Pt given scheduled medications as prescribed. Q15 min checks verified for safety. Patient verbally contracts for safety. Patient compliant with medications and treatment plan. Pt is safe on the unit.   01/21/23 0931  Psych Admission Type (Psych Patients Only)  Admission Status Voluntary  Psychosocial Assessment  Patient Complaints Anxiety;Depression  Eye Contact Fair  Facial Expression Flat  Affect Depressed  Speech Logical/coherent  Interaction Assertive  Motor Activity Slow  Appearance/Hygiene Unremarkable  Behavior Characteristics Cooperative;Calm  Mood Depressed;Sad  Thought Process  Coherency WDL  Content WDL  Delusions None reported or observed  Perception WDL  Hallucination None reported or observed  Judgment Impaired  Confusion None  Danger to Self  Current suicidal ideation? Denies  Self-Injurious Behavior No self-injurious ideation or behavior indicators observed or expressed   Agreement Not to Harm Self Yes  Description of Agreement Pt verbally contracts for safety  Danger to Others  Danger to Others None reported or observed

## 2023-01-21 NOTE — Group Note (Signed)
Date:  01/21/2023 Time:  6:55 PM  Group Topic/Focus:  Goals Group:   The focus of this group is to help patients establish daily goals to achieve during treatment and discuss how the patient can incorporate goal setting into their daily lives to aide in recovery. Orientation:   The focus of this group is to educate the patient on the purpose and policies of crisis stabilization and provide a format to answer questions about their admission.  The group details unit policies and expectations of patients while admitted.    Participation Level:  Did Not Attend  Participation Quality:   n/a  Affect:   n/a  Cognitive:   n/a  Insight: None  Engagement in Group:   n/a  Modes of Intervention:   n/a  Additional Comments:   Pt did not attend  Stark Bray 01/21/2023, 6:55 PM

## 2023-01-21 NOTE — Progress Notes (Signed)
EKG completed, NP notified of result, copy placed on patient's chart.

## 2023-01-21 NOTE — Progress Notes (Signed)
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  Patient presents voluntarily to [facility] emergency room for worsening depression and anxiety resulting in self-harm of biting, hitting, and forcing herself to vomit in the context of no specific stressors."  24 hr chart review:  Sleep Hours last night: Reported a good night sleep Nursing Concerns: None Behavioral episodes in the past 24 hrs: None Medication Compliance: Compliant Vital Signs in the past 24 hrs: Within normal limits PRN Medications in the past 24 hrs: Trazodone and hydroxyzine  Patient assessment note: Pt with flat affect and depressed mood, attention to personal hygiene and grooming is fair, eye contact is good, speech is clear & coherent. Thought contents are organized and logical, and pt currently denies SI/HI/AVH or paranoia. There is no evidence of delusional thoughts.    Patient reported feeling groggy earlier today morning, stated on medications given to her last night with "too much", and stated that she had trouble staying awake earlier today morning.  Patient denies being in any physical pain, and denies any other concerns.  We will discontinue trazodone due to complaints of grogginess, continuing other medications as listed below.  Patient is also on Zyprexa 5 mg scheduled nightly, which might also be causing daytime grogginess. Will dc Trazodone and continue to monitor  to see if grogginess will resolve.  Total Time spent with patient: 45 minutes  Past Psychiatric History: See H & P  Past Medical History:  Past Medical History:  Diagnosis Date   Anorexia    Anxiety    Bulimia    Depression    postpartum   Urticaria     Past Surgical History:  Procedure Laterality Date   NO PAST SURGERIES     Family History:  Family History  Problem Relation Age of Onset   Allergic rhinitis Neg Hx    Angioedema Neg Hx    Asthma Neg Hx    Eczema Neg Hx    Immunodeficiency Neg Hx    Urticaria Neg Hx    Family Psychiatric  History: See H & P Social History:  Social History   Substance and Sexual Activity  Alcohol Use Not Currently   Comment: rarely     Social History   Substance and Sexual Activity  Drug Use Never    Social History   Socioeconomic History   Marital status: Married    Spouse name: Not on file   Number of children: Not on file   Years of education: Not on file   Highest education level: Not on file  Occupational History   Not on file  Tobacco Use   Smoking status: Never   Smokeless tobacco: Never  Vaping Use   Vaping Use: Never used  Substance and Sexual Activity   Alcohol use: Not Currently    Comment: rarely   Drug use: Never   Sexual activity: Not Currently    Birth control/protection: Pill  Other Topics Concern   Not on file  Social History Narrative   Not on file   Social Determinants of Health  Financial Resource Strain: Low Risk  (02/18/2019)   Overall Financial Resource Strain (CARDIA)    Difficulty of Paying Living Expenses: Not hard at all  Food Insecurity: No Food Insecurity (01/19/2023)   Hunger Vital Sign    Worried About Running Out of Food in the Last Year: Never true    Ran Out of Food in the Last Year: Never true  Transportation Needs: No Transportation Needs (01/19/2023)   PRAPARE - Administrator, Civil Service (Medical): No    Lack of Transportation (Non-Medical): No  Physical  Activity: Not on file  Stress: No Stress Concern Present (02/18/2019)   [employer] of Occupational Health - Occupational Stress Questionnaire    Feeling of Stress : Only a little  Social Connections: Not on file   Sleep: Good  Appetite:  Fair  Current Medications: Current Facility-Administered Medications  Medication Dose Route Frequency Provider Last Rate Last Admin   acetaminophen (TYLENOL) tablet 650 mg  650 mg Oral Q6H PRN Massengill, Nathan, MD       alum & mag hydroxide-simeth (MAALOX/MYLANTA) 200-200-20 MG/5ML suspension 30 mL  30 mL Oral Q4H PRN Massengill, Harrold Donath, MD       diphenhydrAMINE (BENADRYL) capsule 50 mg  50 mg Oral TID PRN Phineas Inches, MD       Or   diphenhydrAMINE (BENADRYL) injection 50 mg  50 mg Intramuscular TID PRN Massengill, Harrold Donath, MD       feeding supplement (ENSURE ENLIVE / ENSURE PLUS) liquid 237 mL  237 mL Oral BID BM Ntuen, Jesusita Oka, FNP   237 mL at 01/21/23 1004   FLUoxetine (PROZAC) capsule 20 mg  20 mg Oral Daily Ntuen, Jesusita Oka, FNP   20 mg at 01/21/23 0744   haloperidol (HALDOL) tablet 5 mg  5 mg Oral TID PRN Phineas Inches, MD       Or   haloperidol lactate (HALDOL) injection 5 mg  5 mg Intramuscular TID PRN Massengill, Harrold Donath, MD       hydrOXYzine (ATARAX) tablet 25 mg  25 mg Oral TID PRN Phineas Inches, MD   25 mg at 01/20/23 2154   LORazepam (ATIVAN) tablet 2 mg  2 mg Oral TID PRN Phineas Inches, MD       Or   LORazepam (ATIVAN) injection 2 mg  2 mg Intramuscular TID PRN Massengill, Harrold Donath, MD       magnesium hydroxide (MILK OF MAGNESIA) suspension 30 mL  30 mL Oral Daily PRN Massengill, Nathan, MD       nicotine (NICODERM CQ - dosed in mg/24 hours) patch 21 mg  21 mg Transdermal Q24H Ntuen, Tina C, FNP       norgestimate-ethinyl estradiol (ORTHO-CYCLEN) 0.25-35 MG-MCG tablet 1 tablet  1 tablet Oral Daily Massengill, Nathan, MD   1 tablet at 01/21/23 0744   OLANZapine (ZYPREXA) tablet 5 mg  5 mg Oral QHS Ntuen, Tina C, FNP    5 mg at 01/20/23 2154   propranolol (INDERAL) tablet 10 mg  10 mg Oral BID Cecilie Lowers, FNP   10 mg at 01/20/23 1652    Lab Results: No results found for this or any previous visit (from the past 48 hour(s)).  Blood Alcohol level:  Lab Results  Component Value Date   ETH <10 01/18/2023    Metabolic Disorder Labs: No results found for: "HGBA1C", "MPG" No results found for: "PROLACTIN" Lab Results  Component Value Date   CHOL 158 01/01/2023   TRIG 30 01/01/2023  HDL 60 01/01/2023   CHOLHDL 2.6 01/01/2023   VLDL 6 01/01/2023   LDLCALC 92 01/01/2023    Physical Findings: AIMS:  , ,  ,  ,    CIWA:    COWS:     Musculoskeletal: Strength & Muscle Tone: within normal limits Gait & Station: normal Patient leans: N/A  Psychiatric Specialty Exam:  Presentation  General Appearance:  Appropriate for Environment; Fairly Groomed  Eye Contact: Good  Speech: Clear and Coherent  Speech Volume: Normal  Handedness: Right   Mood and Affect  Mood: Depressed; Anxious  Affect: Congruent   Thought Process  Thought Processes: Coherent  Descriptions of Associations:Intact  Orientation:Full (Time, Place and Person)  Thought Content:Logical  History of Schizophrenia/Schizoaffective disorder:No  Duration of Psychotic Symptoms:No data recorded Hallucinations:Hallucinations: None  Ideas of Reference:None  Suicidal Thoughts:Suicidal Thoughts: No  Homicidal Thoughts:Homicidal Thoughts: No   Sensorium  Memory: Immediate Good  Judgment: Fair  Insight: Fair   Art therapist  Concentration: Fair  Attention Span: Good  Recall: [employer] of Knowledge: Fair  Language: Fair   Psychomotor Activity  Psychomotor Activity: Psychomotor Activity: Normal  Assets  Assets: Resilience  Sleep  Sleep: Sleep: Good Number of Hours of Sleep: 7  Physical Exam: Physical Exam Constitutional:      Appearance: Normal appearance.   Musculoskeletal:        General: Normal range of motion.  Neurological:     Mental Status: She is alert.    Review of Systems  Constitutional:  Negative for fever.  HENT:  Negative for hearing loss.   Eyes:  Negative for blurred vision.  Respiratory:  Negative for cough.   Cardiovascular:  Negative for chest pain.  Gastrointestinal:  Negative for heartburn.  Genitourinary:  Negative for dysuria.  Musculoskeletal:  Negative for myalgias.  Skin:  Negative for rash.  Neurological:  Negative for dizziness.  Psychiatric/Behavioral:  Positive for depression and substance abuse. Negative for hallucinations, memory loss and suicidal ideas. The patient is nervous/anxious and has insomnia.    Blood pressure 110/61, pulse 99, temperature 98.1 F (36.7 C), temperature source Oral, resp. rate 16, height 5\' 1"  (1.549 m), weight 41.4 kg, last menstrual period 01/15/2023, SpO2 100 %. Body mass index is 17.23 kg/m.  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 daily at bedtime for mood stabilization-ordered a repeat EKG d/t prolonged QTc and previous EKG along with "right atrial enlargement".  Also ordered hemoglobin A1c. -Continue Prozac 20 mg tablet p.o. daily for depression -Continue hydroxyzine tablet 25 mg p.o. 3 times daily as needed for anxiety -Discontinue trazodone tablet 50 mg PRN due to complaints of grogginess   Medications for other medical conditions: -Continue propranolol 10 mg p.o. twice daily for elevated pulse -Continue nicotine transdermal patch 21 mg over 24 hours for smoking cessation     Agitation protocol: Benadryl capsule 50 mg p.o. or IM 3 times daily as  needed agitation   Haldol tablets 5 mg po or IM 3 times daily as needed agitation  Lorazepam tablet 2 mg p.o. or IM 3 times daily as needed agitation    Other PRN Medications -Acetaminophen 650 mg every 6 as needed/mild pain -Maalox 30 mL oral every 4 as needed/digestion -Magnesium hydroxide 30 mL daily  as needed/mild constipation   Safety and Monitoring: Voluntary admission to inpatient psychiatric unit for safety, stabilization and treatment Daily contact with patient to assess and evaluate symptoms and progress in treatment Patient's case to be discussed in multi-disciplinary team meeting Observation Level : q15 minute checks Vital signs: q12 hours Precautions: suicide, but pt currently verbally contracts for safety on unit    Discharge Planning: Social work and case management to assist with discharge planning and identification of hospital follow-up needs prior to discharge Estimated LOS: 5-7 days Discharge Concerns: Need to establish a safety plan; Medication compliance and effectiveness Discharge Goals: Return home with outpatient referrals for mental health follow-up including medication management/psychotherapy.   Long Term Goal(s): Improvement in symptoms so as ready for discharge   Short Term Goals: Ability to identify changes in lifestyle to reduce recurrence of condition will improve, Ability to verbalize feelings will improve, Ability to disclose and discuss suicidal ideas, Ability to demonstrate self-control will improve, Ability to identify and develop effective coping behaviors will improve, Ability to maintain clinical measurements within normal limits will improve, Compliance with prescribed medications will improve, and Ability to identify triggers associated with substance abuse/mental health issues will improve   Physician Treatment Plan for Secondary Diagnosis:  Assessment: Principal Problem:   Major depressive disorder, recurrent episode with mood-incongruent  psychotic features (HCC)   I certify that inpatient services furnished can reasonably be expected to improve the patient's condition.      Starleen Blue, NP 01/21/2023, 2:19 PM

## 2023-01-22 DIAGNOSIS — F333 Major depressive disorder, recurrent, severe with psychotic symptoms: Secondary | ICD-10-CM | POA: Diagnosis not present

## 2023-01-22 LAB — HEMOGLOBIN A1C
Hgb A1c MFr Bld: 4.7 % — ABNORMAL LOW (ref 4.8–5.6)
Mean Plasma Glucose: 88.19 mg/dL

## 2023-01-22 NOTE — Progress Notes (Signed)
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  Patient presents voluntarily to [facility] emergency room for worsening depression and anxiety resulting in self-harm of biting, hitting, and forcing herself to vomit in the context of no specific stressors."  24 hr chart review:  Sleep Hours last night: Reported a good night sleep Nursing Concerns: None Behavioral episodes in the past 24 hrs: None Medication Compliance: Compliant Vital Signs in the past 24 hrs: Within normal limits, with slight elevations in HR earlier today morning at 107, but rechecked and was WNL.  PRN Medications in the past 24 hrs:  Hydroxyzine  Patient assessment note: Pt presents today with a significantly less depressed mood as compared to yesterday. Her attention to personal hygiene and grooming is fair, eye contact is good, speech is clear & coherent. Thought contents are organized and logical, and pt currently denies SI/HI/AVH or paranoia. There is no evidence of delusional thoughts.    Pt reports that sleep last night was good, states that she received only the Hydroxyzine, and does not feel groggy today. She reports that her appetite fair and improving, denies medication related side effects. Pt rates her overall mood as 5 (10 being best), continues to report some depression and some anxiety, but states meds are helping.  Projected discharge date for patient is 6/18,  pending all safety plans been completed and also outpatient appointments being made.  This has been explained to patient who verbalizes understanding.  We are continuing medications as listed below with no changes today.  Total Time spent with patient: 45 minutes  Past Psychiatric History: See H & P  Past Medical History:  Past Medical History:  Diagnosis Date   Anorexia    Anxiety    Bulimia    Depression    postpartum   Urticaria     Past Surgical History:  Procedure Laterality Date   NO PAST SURGERIES     Family History:  Family History  Problem Relation Age of Onset   Allergic rhinitis Neg Hx    Angioedema Neg Hx    Asthma Neg Hx    Eczema Neg Hx    Immunodeficiency Neg Hx    Urticaria Neg Hx    Family Psychiatric  History: See H & P Social History:  Social History   Substance and Sexual Activity  Alcohol Use Not Currently   Comment: rarely     Social History   Substance and Sexual Activity  Drug Use Never    Social History   Socioeconomic History   Marital status: Married    Spouse name: Not on file   Number of children: Not on file   Years of education: Not on file   Highest education level: Not on file  Occupational History   Not on file  Tobacco Use   Smoking status: Never   Smokeless tobacco: Never  Vaping Use   Vaping Use: Never used  Substance and Sexual Activity   Alcohol use: Not Currently    Comment: rarely   Drug use: Never   Sexual activity: Not  Currently    Birth control/protection: Pill  Other Topics Concern   Not on file  Social History Narrative   Not on file   Social Determinants of Health   Financial Resource Strain: Low Risk  (02/18/2019)   Overall Financial Resource Strain (CARDIA)    Difficulty of Paying Living Expenses: Not hard at all  Food Insecurity: No Food Insecurity (01/19/2023)   Hunger Vital Sign    Worried About Running Out of Food in the Last Year: Never true    Ran Out of Food in the Last Year: Never true   Transportation Needs: No Transportation Needs (01/19/2023)   PRAPARE - Administrator, Civil Service (Medical): No    Lack of Transportation (Non-Medical): No  Physical Activity: Not on file  Stress: No Stress Concern Present (02/18/2019)   [employer] of Occupational Health - Occupational Stress Questionnaire    Feeling of Stress : Only a little  Social Connections: Not on file   Sleep: Good  Appetite:  Fair  Current Medications: Current Facility-Administered Medications  Medication Dose Route Frequency Provider Last Rate Last Admin   acetaminophen (TYLENOL) tablet 650 mg  650 mg Oral Q6H PRN Massengill, Nathan, MD       alum & mag hydroxide-simeth (MAALOX/MYLANTA) 200-200-20 MG/5ML suspension 30 mL  30 mL Oral Q4H PRN Massengill, Harrold Donath, MD       diphenhydrAMINE (BENADRYL) capsule 50 mg  50 mg Oral TID PRN Phineas Inches, MD       Or   diphenhydrAMINE (BENADRYL) injection 50 mg  50 mg Intramuscular TID PRN Massengill, Harrold Donath, MD       feeding supplement (ENSURE ENLIVE / ENSURE PLUS) liquid 237 mL  237 mL Oral BID BM Ntuen, Jesusita Oka, FNP   237 mL at 01/21/23 1004   FLUoxetine (PROZAC) capsule 20 mg  20 mg Oral Daily Ntuen, Jesusita Oka, FNP   20 mg at 01/22/23 0827   haloperidol (HALDOL) tablet 5 mg  5 mg Oral TID PRN Phineas Inches, MD       Or   haloperidol lactate (HALDOL) injection 5 mg  5 mg Intramuscular TID PRN Massengill, Harrold Donath, MD       hydrOXYzine (ATARAX) tablet 25 mg  25 mg Oral TID PRN Phineas Inches, MD   25 mg at 01/21/23 2111   LORazepam (ATIVAN) tablet 2 mg  2 mg Oral TID PRN Phineas Inches, MD       Or   LORazepam (ATIVAN) injection 2 mg  2 mg Intramuscular TID PRN Massengill, Harrold Donath, MD       magnesium hydroxide (MILK OF MAGNESIA) suspension 30 mL  30 mL Oral Daily PRN Massengill, Nathan, MD       nicotine (NICODERM CQ - dosed in mg/24 hours) patch 21 mg  21 mg Transdermal Q24H Ntuen, Tina C, FNP       norgestimate-ethinyl estradiol  (ORTHO-CYCLEN) 0.25-35 MG-MCG tablet 1 tablet  1 tablet Oral Daily Massengill, Nathan, MD   1 tablet at 01/22/23 0826   OLANZapine (ZYPREXA) tablet 5 mg  5 mg Oral QHS Ntuen, Tina C, FNP   5 mg at 01/21/23 2111   propranolol (INDERAL) tablet 10 mg  10 mg Oral BID Cecilie Lowers, FNP   10 mg at 01/22/23 1111    Lab Results:  Results for orders placed or performed during the hospital encounter of 01/19/23 (from the past 48 hour(s))  Hemoglobin A1c     Status: Abnormal   Collection Time: 01/22/23  6:24 AM  Result Value Ref Range   Hgb A1c MFr Bld 4.7 (L) 4.8 - 5.6 %    Comment: (NOTE) Pre diabetes:          5.7%-6.4%  Diabetes:              >6.4%  Glycemic control for   <7.0% adults with diabetes    Mean Plasma Glucose 88.19 mg/dL    Comment: Performed at [facility], [address], [place], [place] [address]    Blood Alcohol level:  Lab Results  Component Value Date   ETH <10 01/18/2023    Metabolic Disorder Labs: Lab Results  Component Value Date   HGBA1C 4.7 (L) 01/22/2023   MPG 88.19 01/22/2023   No results found for: "PROLACTIN" Lab Results  Component Value Date   CHOL 158 01/01/2023   TRIG 30 01/01/2023   HDL 60 01/01/2023   CHOLHDL 2.6 01/01/2023   VLDL 6 01/01/2023   LDLCALC 92 01/01/2023    Physical Findings: AIMS:  , ,  ,  ,    CIWA:    COWS:     Musculoskeletal: Strength & Muscle Tone: within normal limits Gait & Station: normal Patient leans: N/A  Psychiatric Specialty Exam:  Presentation  General Appearance:  Appropriate for Environment; Fairly Groomed  Eye Contact: Fair  Speech: Clear and Coherent  Speech Volume: Normal  Handedness: Right   Mood and Affect  Mood: Depressed  Affect: Congruent   Thought Process  Thought Processes: Coherent  Descriptions of Associations:Intact  Orientation:Full (Time, Place and Person)  Thought Content:Logical  History of Schizophrenia/Schizoaffective disorder:No  Duration  of Psychotic Symptoms:No data recorded Hallucinations:Hallucinations: None  Ideas of Reference:None  Suicidal Thoughts:Suicidal Thoughts: No  Homicidal Thoughts:Homicidal Thoughts: No   Sensorium  Memory: Immediate Good  Judgment: Fair  Insight: Fair   Art therapist  Concentration: Good  Attention Span: Good  Recall: Good  Fund of Knowledge: Fair  Language: Good   Psychomotor Activity  Psychomotor Activity: Psychomotor Activity: Normal  Assets  Assets: Communication Skills; Resilience  Sleep  Sleep: Sleep: Good  Physical Exam: Physical Exam Constitutional:      Appearance: Normal appearance.  Musculoskeletal:        General: Normal range of motion.  Neurological:     Mental Status: She is alert.    Review of Systems  Constitutional:  Negative for fever.  HENT:  Negative for hearing loss.   Eyes:  Negative for blurred vision.  Respiratory:  Negative for cough.   Cardiovascular:  Negative for chest pain.  Gastrointestinal:  Negative for heartburn.  Genitourinary:  Negative for dysuria.  Musculoskeletal:  Negative for myalgias.  Skin:  Negative for rash.  Neurological:  Negative for dizziness.  Psychiatric/Behavioral:  Positive for depression and substance abuse. Negative for hallucinations, memory loss and suicidal ideas. The patient is nervous/anxious and has insomnia.    Blood pressure 122/87, pulse 85, temperature 97.7 F (36.5 C), temperature source Oral, resp. rate 18, height 5\' 1"  (1.549 m), weight 41.4 kg, last menstrual period 01/15/2023, SpO2 100 %. Body mass index is 17.23 kg/m.  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 daily at bedtime for mood stabilization-ordered a repeat EKG d/t prolonged QTc and previous  EKG along with "right atrial enlargement".  New EKG with QTc less prolonged at 457, as compared to previous one at 483.  hemoglobin A1c wnl. -Continue Prozac 20 mg tablet p.o. daily for depression -Continue hydroxyzine tablet 25 mg p.o. 3 times daily as needed for anxiety -Discontinued trazodone on 6/15 due to complaints of grogginess   Medications for other medical conditions: -Continue propranolol 10 mg p.o. twice daily for elevated pulse -Continue nicotine transdermal patch 21 mg over 24 hours for smoking cessation     Agitation protocol: Benadryl capsule 50 mg p.o. or IM 3 times daily as needed agitation   Haldol tablets 5 mg po or IM 3 times daily as needed agitation  Lorazepam tablet 2 mg p.o. or IM 3 times daily as needed agitation    Other PRN Medications -Acetaminophen 650 mg every 6 as needed/mild pain -Maalox 30 mL oral every 4 as needed/digestion -Magnesium hydroxide 30 mL daily as needed/mild constipation   Safety and Monitoring: Voluntary admission to inpatient psychiatric unit for safety, stabilization and treatment Daily contact with patient to assess and evaluate symptoms and progress in treatment Patient's case to be discussed in multi-disciplinary team meeting Observation Level : q15 minute checks Vital signs: q12 hours Precautions: suicide, but pt currently verbally contracts for safety on unit    Discharge Planning: Social work and case management to assist with discharge planning and identification of hospital follow-up needs prior to discharge Estimated LOS: 5-7 days Discharge Concerns: Need to establish a safety plan; Medication compliance and effectiveness Discharge Goals: Return home with outpatient referrals for mental health follow-up including medication management/psychotherapy.   Long Term Goal(s): Improvement in symptoms so as ready for  discharge   Short Term Goals: Ability to identify changes in lifestyle to reduce recurrence of condition will improve, Ability to verbalize feelings will improve, Ability to disclose and discuss suicidal ideas, Ability to demonstrate self-control will improve, Ability to identify and develop effective coping behaviors will improve, Ability to maintain clinical measurements within normal limits will improve, Compliance with prescribed medications will improve, and Ability to identify triggers associated with substance abuse/mental health issues will improve   Physician Treatment Plan for Secondary Diagnosis:  Assessment: Principal Problem:   Major depressive disorder, 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      Starleen Blue, NP 01/22/2023, 1:39 PMPatient ID: Stacie Eaton, female   DOB: [birth year], 27 y.o.   MRN: [id number]

## 2023-01-22 NOTE — Plan of Care (Signed)
  Problem: Education: Goal: Verbalization of understanding the information provided will improve Outcome: Progressing   Problem: Activity: Goal: Interest or engagement in activities will improve Outcome: Progressing Goal: Sleeping patterns will improve Outcome: Progressing   Problem: Coping: Goal: Ability to demonstrate self-control will improve Outcome: Progressing   Problem: Health Behavior/Discharge Planning: Goal: Identification of resources available to assist in meeting health care needs will improve Outcome: Progressing Goal: Compliance with treatment plan for underlying cause of condition will improve Outcome: Progressing   Problem: Safety: Goal: Periods of time without injury will increase Outcome: Progressing   Problem: Education: Goal: Utilization of techniques to improve thought processes will improve Outcome: Progressing Goal: Knowledge of the prescribed therapeutic regimen will improve Outcome: Progressing   Problem: Activity: Goal: Interest or engagement in leisure activities will improve Outcome: Progressing

## 2023-01-22 NOTE — Group Note (Signed)
[facility] LCSW Group Therapy Note  01/22/2023  10:00-11:00AM  Type of Therapy and Topic:  Group Therapy:  Adding Supports Including Being Your Own Support  Participation Level:  Active   Description of Group:  Patients in this group were introduced to the concept that additional supports including self-support are an essential part of recovery.  After a discussion about the differences between healthy supports and unheathy supports, a song entitled "My Own Hero" was played.  A group discussion ensued in which patients stated they could relate to the song and it inspired them to realize they have be willing to help themselves in order to succeed, because other people cannot achieve sobriety or stability for them.  We discussed adding a variety of healthy supports to address the various needs in our lives.  We also talked about how to put up necessary boundaries to limit unhealthy supports from harming [place].  Therapeutic Goals: 1)  demonstrate the importance of being a part of one's own support system 2)  discuss reasons people in one's life may eventually be unable to be continually supportive  3)  identify the patient's current support system and   4)  elicit commitments to add healthy supports and to become more conscious of being self-supportive   Summary of Patient Progress:  The patient expressed her healthy support(s) right now include husband while unhealthy supports include none, other than not having enough supports.  The patient's overall reaction to this topic was positive.  She was attentive but quiet.  Therapeutic Modalities:   Motivational Interviewing Activity  Lynnell Chad

## 2023-01-22 NOTE — [facility] Group Notes (Signed)
Adult Psychoeducational Group Note  Date:  01/22/2023 Time:  10:08 PM  Group Topic/Focus:  Wrap-Up Group:   The focus of this group is to help patients review their daily goal of treatment and discuss progress on daily workbooks.  Participation Level:  Active  Participation Quality:  Appropriate  Affect:  Angry  Cognitive:  Appropriate  Insight: Appropriate  Engagement in Group:  Engaged  Modes of Intervention:  Discussion and Education  Additional Comments:  Pt attended and participated in group. Pt shared that her goal was to attend all groups and she was able to accomplish her goal.  Shelby Mattocks 01/22/2023, 10:08 PM

## 2023-01-22 NOTE — Progress Notes (Signed)
   01/22/23 0800  Psych Admission Type (Psych Patients Only)  Admission Status Voluntary  Psychosocial Assessment  Patient Complaints None  Eye Contact Fair  Facial Expression Animated  Affect Appropriate to circumstance  Speech Logical/coherent  Interaction Assertive  Motor Activity Other (Comment) (Unremarkable.)  Appearance/Hygiene Unremarkable  Behavior Characteristics Appropriate to situation;Cooperative  Mood Anxious;Pleasant  Thought Process  Coherency WDL  Content WDL  Delusions None reported or observed  Perception WDL  Hallucination None reported or observed  Judgment Impaired  Confusion None  Danger to Self  Current suicidal ideation? Denies  Self-Injurious Behavior No self-injurious ideation or behavior indicators observed or expressed   Agreement Not to Harm Self Yes  Description of Agreement Verbal  Danger to Others  Danger to Others None reported or observed

## 2023-01-22 NOTE — [facility] Group Notes (Signed)
[facility] Group Notes:  (Nursing/MHT/Case Management/Adjunct)  Date:  01/22/2023  Time: 1000  Type of Therapy:  Psychoeducational Skills  Participation Level:  Active  Participation Quality:  Appropriate  Affect:  Appropriate  Cognitive:  Appropriate  Insight:  Appropriate  Engagement in Group:  Engaged  Modes of Intervention:  Discussion, Education, and Exploration  Summary of Progress/Problems: Patients were given two poems to read, one titled ''watch your thoughts for they become your actions'' and another '' the owl and the chimpanzee'' by Jo Camacho. Patients were given education on positive reframing and asked to identify one negative belief about themselves they would like to heal from to impact positive mental well being. Discussion, education and education of healthy coping skills were also discussed in group. Pt shared and was attentive.   Stacie Eaton 01/22/2023, 11:11 AM 

## 2023-01-22 NOTE — Progress Notes (Signed)
   01/22/23 1950  Psych Admission Type (Psych Patients Only)  Admission Status Voluntary  Psychosocial Assessment  Patient Complaints None  Eye Contact Fair  Facial Expression Animated  Affect Appropriate to circumstance  Speech Logical/coherent  Interaction Assertive  Motor Activity Other (Comment)  Appearance/Hygiene Unremarkable  Behavior Characteristics Appropriate to situation;Cooperative  Mood Anxious;Pleasant  Thought Process  Coherency WDL  Content WDL  Delusions None reported or observed  Perception WDL  Hallucination None reported or observed  Judgment Poor  Confusion None  Danger to Self  Current suicidal ideation? Denies  Self-Injurious Behavior No self-injurious ideation or behavior indicators observed or expressed   Agreement Not to Harm Self Yes  Description of Agreement verbally contracts for safety.  Danger to Others  Danger to Others None reported or observed  Danger to Others Abnormal  Harmful Behavior to others No threats or harm toward other people  Destructive Behavior No threats or harm toward property

## 2023-01-22 NOTE — [facility] Group Notes (Signed)
[facility] Group Notes:  (Nursing/MHT/Case Management/Adjunct)  Date:  01/22/2023  Time: 0900  Type of Therapy:  Psychoeducational Skills/Daily Goals Group   Participation Level:  Active  Participation Quality:  Appropriate  Affect:  Appropriate  Cognitive:  Appropriate  Insight:  Appropriate  Engagement in Group:  Distracting and Engaged  Modes of Intervention:  Discussion, Education, and Exploration  Summary of Progress/Problems: Daily Goals/Orientation Group. Pts were asked to check in and share how they were feeling emotionally.   Stacie Eaton 01/22/2023, 11:04 AM 

## 2023-01-23 DIAGNOSIS — F333 Major depressive disorder, recurrent, severe with psychotic symptoms: Secondary | ICD-10-CM | POA: Diagnosis not present

## 2023-01-23 MED ORDER — FLUOXETINE HCL 20 MG PO CAPS: 20.0000 mg | ORAL_CAPSULE | Freq: Every day | ORAL | 0 refills | Status: DC

## 2023-01-23 MED ORDER — NICOTINE 21 MG/24HR TD PT24: 21.0000 mg | MEDICATED_PATCH | TRANSDERMAL | 0 refills | Status: DC

## 2023-01-23 MED ORDER — PROPRANOLOL HCL 10 MG PO TABS: 10.0000 mg | ORAL_TABLET | Freq: Two times a day (BID) | ORAL | 0 refills | Status: DC

## 2023-01-23 MED ORDER — OLANZAPINE 5 MG PO TABS: 5.0000 mg | ORAL_TABLET | Freq: Every day | ORAL | 0 refills | Status: DC

## 2023-01-23 NOTE — Progress Notes (Signed)
   01/23/23 0837  Psych Admission Type (Psych Patients Only)  Admission Status Voluntary  Psychosocial Assessment  Patient Complaints None  Eye Contact Fair  Facial Expression Animated  Affect Appropriate to circumstance  Speech Logical/coherent  Interaction Assertive  Motor Activity Other (Comment) (WDL)  Appearance/Hygiene Excess makeup;Unremarkable  Behavior Characteristics Appropriate to situation  Mood Anxious;Pleasant  Aggressive Behavior  Effect No apparent injury  Thought Process  Coherency WDL  Content WDL  Delusions None reported or observed  Perception WDL  Hallucination None reported or observed  Judgment Poor  Confusion None  Danger to Self  Current suicidal ideation? Denies  Self-Injurious Behavior No self-injurious ideation or behavior indicators observed or expressed   Agreement Not to Harm Self Yes  Description of Agreement Verbally Contracts for safety  Danger to Others  Danger to Others None reported or observed  Danger to Others Abnormal  Harmful Behavior to others No threats or harm toward other people  Destructive Behavior No threats or harm toward property

## 2023-01-23 NOTE — Plan of Care (Signed)
  Problem: Education: Goal: Knowledge of [facility] Education information/materials will improve 01/23/2023 1243 by Melvenia Needles, RN Outcome: Progressing 01/23/2023 1242 by Melvenia Needles, RN Outcome: Progressing Goal: Emotional status will improve 01/23/2023 1243 by Melvenia Needles, RN Outcome: Progressing 01/23/2023 1242 by Melvenia Needles, RN Outcome: Progressing Goal: Mental status will improve 01/23/2023 1243 by Melvenia Needles, RN Outcome: Progressing 01/23/2023 1242 by Melvenia Needles, RN Outcome: Progressing Goal: Verbalization of understanding the information provided will improve 01/23/2023 1243 by Melvenia Needles, RN Outcome: Progressing 01/23/2023 1242 by Melvenia Needles, RN Outcome: Progressing   Problem: Coping: Goal: Ability to verbalize frustrations and anger appropriately will improve 01/23/2023 1243 by Melvenia Needles, RN Outcome: Progressing 01/23/2023 1242 by Melvenia Needles, RN Outcome: Progressing Goal: Ability to demonstrate self-control will improve 01/23/2023 1243 by Melvenia Needles, RN Outcome: Progressing 01/23/2023 1242 by Melvenia Needles, RN Outcome: Progressing

## 2023-01-23 NOTE — [facility] Group Notes (Signed)
[facility] Group Notes:  (Nursing/MHT/Case Management/Adjunct)  Date:  01/23/2023  Time:  8:00  Type of Therapy:   AA MEETING  Participation Level:  Active  Participation Quality:  Appropriate and Attentive  Affect:  Appropriate  Cognitive:  Alert and Appropriate  Insight:  Good  Engagement in Group:  Developing/Improving  Modes of Intervention:  Discussion, Education, and Support  Summary of Progress/Problems:  Emmalin Jaquess 01/23/2023, 10:43 PM

## 2023-01-23 NOTE — Group Note (Signed)
Occupational Therapy Group Note  Group Topic: Sleep Hygiene  Group Date: 01/23/2023 Start Time: 1430 End Time: 1505 Facilitators: Alecxis Baltzell G, OT   Group Description: Group encouraged increased participation and engagement through topic focused on sleep hygiene. Patients reflected on the quality of sleep they typically receive and identified areas that need improvement. Group was given background information on sleep and sleep hygiene, including common sleep disorders. Group members also received information on how to improve one's sleep and introduced a sleep diary as a tool that can be utilized to track sleep quality over a length of time. Group session ended with patients identifying one or more strategies they could utilize or implement into their sleep routine in order to improve overall sleep quality.        Therapeutic Goal(s):  Identify one or more strategies to improve overall sleep hygiene  Identify one or more areas of sleep that are negatively impacted (sleep too much, too little, etc)     Participation Level: Engaged   Participation Quality: Independent   Behavior: Appropriate   Speech/Thought Process: Relevant   Affect/Mood: Appropriate   Insight: Good   Judgement: Good      Modes of Intervention: Education  Patient Response to Interventions:  Attentive   Plan: Continue to engage patient in OT groups 2 - 3x/week.  01/23/2023  Maddax Palinkas G Mellie Buccellato, OT  Ketzaly Cardella, OT   

## 2023-01-23 NOTE — Progress Notes (Signed)
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  Patient presents voluntarily to [facility] emergency room for worsening depression and anxiety resulting in self-harm of biting, hitting, and forcing herself to vomit in the context of no specific stressors."  24 hr chart review:  Sleep Hours last night: Reports a good night sleep Nursing Concerns: None Behavioral episodes in the past 24 hrs: None Medication Compliance: Compliant Vital Signs in the past 24 hrs: Within normal limits, with slight elevations in HR earlier today morning at 107, but rechecked and was WNL.  PRN Medications in the past 24 hrs:  None  Patient assessment note: Pt presents today with a euthymic mood as compared to yesterday. Her attention to personal hygiene and grooming is fair, eye contact is good, speech is clear & coherent. Thought contents are organized and logical, and pt currently denies SI/HI/AVH or paranoia. There is no evidence of delusional thoughts.    Continues to report a good sleep quality at night, continues to report appetite as good, denies medication related side effects.  Projected discharge date for patient continues to be 6/18, pending all safety plans been completed and also outpatient appointments being made.  Pt will benefit from remaining hospitalized an additional day as she is benefiting from the  unit group sessions. We will continue medications  as noted below with no changes today.  Total Time spent with patient: 45 minutes  Past Psychiatric History: See H & P  Past Medical History:  Past Medical History:  Diagnosis Date   Anorexia    Anxiety    Bulimia    Depression    postpartum   Urticaria     Past Surgical History:  Procedure Laterality Date   NO PAST SURGERIES     Family History:  Family History  Problem Relation Age of Onset   Allergic rhinitis Neg Hx    Angioedema Neg Hx    Asthma Neg Hx    Eczema Neg Hx    Immunodeficiency Neg Hx    Urticaria Neg Hx    Family Psychiatric  History: See H & P Social History:  Social History   Substance and Sexual Activity  Alcohol Use Not Currently   Comment: rarely     Social History   Substance and Sexual Activity  Drug Use Never    Social History   Socioeconomic History   Marital status: Married    Spouse name: Not on file   Number of children: Not on file   Years of education: Not on file   Highest education level: Not on file  Occupational History   Not on file  Tobacco Use   Smoking status: Never   Smokeless tobacco: Never  Vaping Use   Vaping Use: Never used  Substance and Sexual Activity   Alcohol use: Not Currently    Comment: rarely   Drug use: Never   Sexual activity: Not Currently    Birth control/protection: Pill  Other Topics Concern   Not on file  Social History Narrative   Not on file   Social  Determinants of Health   Financial Resource Strain: Low Risk  (02/18/2019)   Overall Financial Resource Strain (CARDIA)    Difficulty of Paying Living Expenses: Not hard at all  Food Insecurity: No Food Insecurity (01/19/2023)   Hunger Vital Sign    Worried About Running Out of Food in the Last Year: Never true    Ran Out of Food in the Last Year: Never true  Transportation Needs: No Transportation Needs (01/19/2023)   PRAPARE - Administrator, Civil Service (Medical): No    Lack of Transportation (Non-Medical): No   Physical Activity: Not on file  Stress: No Stress Concern Present (02/18/2019)   [employer] of Occupational Health - Occupational Stress Questionnaire    Feeling of Stress : Only a little  Social Connections: Not on file   Sleep: Good  Appetite:  Fair  Current Medications: Current Facility-Administered Medications  Medication Dose Route Frequency Provider Last Rate Last Admin   acetaminophen (TYLENOL) tablet 650 mg  650 mg Oral Q6H PRN Massengill, Nathan, MD       alum & mag hydroxide-simeth (MAALOX/MYLANTA) 200-200-20 MG/5ML suspension 30 mL  30 mL Oral Q4H PRN Massengill, Harrold Donath, MD       diphenhydrAMINE (BENADRYL) capsule 50 mg  50 mg Oral TID PRN Phineas Inches, MD       Or   diphenhydrAMINE (BENADRYL) injection 50 mg  50 mg Intramuscular TID PRN Massengill, Harrold Donath, MD       feeding supplement (ENSURE ENLIVE / ENSURE PLUS) liquid 237 mL  237 mL Oral BID BM Ntuen, Jesusita Oka, FNP   237 mL at 01/21/23 1004   FLUoxetine (PROZAC) capsule 20 mg  20 mg Oral Daily Ntuen, Jesusita Oka, FNP   20 mg at 01/23/23 1610   haloperidol (HALDOL) tablet 5 mg  5 mg Oral TID PRN Phineas Inches, MD       Or   haloperidol lactate (HALDOL) injection 5 mg  5 mg Intramuscular TID PRN Massengill, Harrold Donath, MD       hydrOXYzine (ATARAX) tablet 25 mg  25 mg Oral TID PRN Phineas Inches, MD   25 mg at 01/21/23 2111   LORazepam (ATIVAN) tablet 2 mg  2 mg Oral TID PRN Phineas Inches, MD       Or   LORazepam (ATIVAN) injection 2 mg  2 mg Intramuscular TID PRN Massengill, Harrold Donath, MD       magnesium hydroxide (MILK OF MAGNESIA) suspension 30 mL  30 mL Oral Daily PRN Massengill, Nathan, MD       nicotine (NICODERM CQ - dosed in mg/24 hours) patch 21 mg  21 mg Transdermal Q24H Ntuen, Tina C, FNP       norgestimate-ethinyl estradiol (ORTHO-CYCLEN) 0.25-35 MG-MCG tablet 1 tablet  1 tablet Oral Daily Massengill, Harrold Donath, MD   1 tablet at 01/23/23 0837   OLANZapine (ZYPREXA) tablet 5 mg  5 mg Oral QHS Ntuen,  Tina C, FNP   5 mg at 01/22/23 2122   propranolol (INDERAL) tablet 10 mg  10 mg Oral BID Cecilie Lowers, FNP   10 mg at 01/23/23 9604    Lab Results:  Results for orders placed or performed during the hospital encounter of 01/19/23 (from the past 48 hour(s))  Hemoglobin A1c     Status: Abnormal   Collection Time: 01/22/23  6:24 AM  Result Value Ref Range   Hgb A1c MFr Bld 4.7 (L) 4.8 - 5.6 %    Comment: (NOTE) Pre diabetes:  5.7%-6.4%  Diabetes:              >6.4%  Glycemic control for   <7.0% adults with diabetes    Mean Plasma Glucose 88.19 mg/dL    Comment: Performed at [facility], [address] [place], [place] [address]    Blood Alcohol level:  Lab Results  Component Value Date   ETH <10 01/18/2023    Metabolic Disorder Labs: Lab Results  Component Value Date   HGBA1C 4.7 (L) 01/22/2023   MPG 88.19 01/22/2023   No results found for: "PROLACTIN" Lab Results  Component Value Date   CHOL 158 01/01/2023   TRIG 30 01/01/2023   HDL 60 01/01/2023   CHOLHDL 2.6 01/01/2023   VLDL 6 01/01/2023   LDLCALC 92 01/01/2023   Physical Findings: AIMS:0 CIWA:  n/a COWS:  n/a  Musculoskeletal: Strength & Muscle Tone: within normal limits Gait & Station: normal Patient leans: N/A  Psychiatric Specialty Exam:  Presentation  General Appearance:  Appropriate for Environment; Fairly Groomed  Eye Contact: Fair  Speech: Clear and Coherent  Speech Volume: Normal  Handedness: Right   Mood and Affect  Mood: Euthymic  Affect: Congruent   Thought Process  Thought Processes: Coherent  Descriptions of Associations:Intact  Orientation:Full (Time, Place and Person)  Thought Content:Logical  History of Schizophrenia/Schizoaffective disorder:No  Duration of Psychotic Symptoms:No data recorded Hallucinations:Hallucinations: None  Ideas of Reference:None  Suicidal Thoughts:Suicidal Thoughts: No  Homicidal Thoughts:Homicidal Thoughts:  No   Sensorium  Memory: Immediate Good  Judgment: Good  Insight: Good   Executive Functions  Concentration: Good  Attention Span: Good  Recall: Good  Fund of Knowledge: Good  Language: Good   Psychomotor Activity  Psychomotor Activity: Psychomotor Activity: Normal  Assets  Assets: Communication Skills; Social Support; Resilience  Sleep  Sleep: Sleep: Good  Physical Exam: Physical Exam Constitutional:      Appearance: Normal appearance.  Musculoskeletal:        General: Normal range of motion.  Neurological:     Mental Status: She is alert.    Review of Systems  Constitutional:  Negative for fever.  HENT:  Negative for hearing loss.   Eyes:  Negative for blurred vision.  Respiratory:  Negative for cough.   Cardiovascular:  Negative for chest pain.  Gastrointestinal:  Negative for heartburn.  Genitourinary:  Negative for dysuria.  Musculoskeletal:  Negative for myalgias.  Skin:  Negative for rash.  Neurological:  Negative for dizziness.  Psychiatric/Behavioral:  Positive for depression and substance abuse. Negative for hallucinations, memory loss and suicidal ideas. The patient is nervous/anxious and has insomnia.    Blood pressure 103/72, pulse 86, temperature 98.1 F (36.7 C), temperature source Oral, resp. rate 16, height 5\' 1"  (1.549 m), weight 41.4 kg, last menstrual period 01/15/2023, SpO2 100 %. Body mass index is 17.23 kg/m.  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 daily at bedtime for mood stabilization-ordered a repeat EKG d/t prolonged QTc and previous EKG along with "right atrial enlargement".  New EKG with QTc less  prolonged at 457, as compared to previous one at 483.  hemoglobin A1c wnl. -Continue Prozac 20 mg tablet p.o. daily for depression -Continue hydroxyzine tablet 25 mg p.o. 3 times daily as needed  for anxiety -Discontinued trazodone on 6/15 due to complaints of grogginess   Medications for other medical conditions: -Continue propranolol 10 mg p.o. twice daily for elevated pulse -Continue nicotine transdermal patch 21 mg over 24 hours for smoking cessation     Agitation protocol: Benadryl capsule 50 mg p.o. or IM 3 times daily as needed agitation   Haldol tablets 5 mg po or IM 3 times daily as needed agitation  Lorazepam tablet 2 mg p.o. or IM 3 times daily as needed agitation    Other PRN Medications -Acetaminophen 650 mg every 6 as needed/mild pain -Maalox 30 mL oral every 4 as needed/digestion -Magnesium hydroxide 30 mL daily as needed/mild constipation   Safety and Monitoring: Voluntary admission to inpatient psychiatric unit for safety, stabilization and treatment Daily contact with patient to assess and evaluate symptoms and progress in treatment Patient's case to be discussed in multi-disciplinary team meeting Observation Level : q15 minute checks Vital signs: q12 hours Precautions: suicide, but pt currently verbally contracts for safety on unit    Discharge Planning: Social work and case management to assist with discharge planning and identification of hospital follow-up needs prior to discharge Estimated LOS: 5-7 days Discharge Concerns: Need to establish a safety plan; Medication compliance and effectiveness Discharge Goals: Return home with outpatient referrals for mental health follow-up including medication management/psychotherapy.   Long Term Goal(s): Improvement in symptoms so as ready for discharge   Short Term Goals: Ability to identify changes in lifestyle to reduce recurrence of condition will improve, Ability to verbalize feelings will improve, Ability to  disclose and discuss suicidal ideas, Ability to demonstrate self-control will improve, Ability to identify and develop effective coping behaviors will improve, Ability to maintain clinical measurements within normal limits will improve, Compliance with prescribed medications will improve, and Ability to identify triggers associated with substance abuse/mental health issues will improve   Physician Treatment Plan for Secondary Diagnosis:  Assessment: Principal Problem:   Major depressive disorder, 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      Starleen Blue, NP 01/23/2023, 2:15 PMPatient ID: Stacie Eaton, female   DOB: 1996/05/02, 27 y.o.   MRN: [id number] Patient ID: Stacie Eaton, female   DOB: 02/03/1996, 27 y.o.   MRN: [id number]

## 2023-01-23 NOTE — Group Note (Signed)
Recreation Therapy Group Note   Group Topic:Team Building  Group Date: 01/23/2023 Start Time: 0935 End Time: 1006 Facilitators: Aritza Brunet-McCall, LRT,CTRS Location: [address] Day[address]   Goal Area(s) Addresses:  Patient will effectively work with peer towards shared goal.  Patient will identify skills used to make activity successful.  Patient will identify how skills used during activity can be applied to reach post d/c goals.    Group Description: [employer]. In teams of 5-6, patients were given 11 craft pipe cleaners. Using the materials provided, patients were instructed to compete again the opposing team(s) to build the tallest free-standing structure from floor level. The activity was timed; difficulty increased by Clinical research associate as Production designer, theatre/television/film continued.  Systematically resources were removed with additional directions for example, placing one arm behind their back, working in silence, and shape stipulations. LRT facilitated post-activity discussion reviewing team processes and necessary communication skills involved in completion. Patients were encouraged to reflect how the skills utilized, or not utilized, in this activity can be incorporated to positively impact support systems post discharge.   Affect/Mood: N/A   Participation Level: Did not attend    Clinical Observations/Individualized Feedback:    Plan: Continue to engage patient in RT group sessions 2-3x/week.   Esteven Overfelt-McCall, LRT,CTRS  01/23/2023 11:50 AM

## 2023-01-23 NOTE — Progress Notes (Signed)
   01/23/23 2300  Psych Admission Type (Psych Patients Only)  Admission Status Voluntary  Psychosocial Assessment  Patient Complaints None  Eye Contact Fair  Facial Expression Other (Comment) (wdl)  Affect Appropriate to circumstance  Speech Logical/coherent  Interaction Assertive  Motor Activity Other (Comment) (wdl)  Appearance/Hygiene Unremarkable  Behavior Characteristics Appropriate to situation;Cooperative;Calm  Mood Pleasant  Thought Process  Coherency WDL  Content WDL  Delusions None reported or observed  Perception WDL  Hallucination None reported or observed  Judgment WDL  Confusion None  Danger to Self  Current suicidal ideation? Denies (denies)  Description of Suicide Plan no plan  Self-Injurious Behavior  (none)  Agreement Not to Harm Self Yes  Description of Agreement verbal  Danger to Others  Danger to Others None reported or observed  Danger to Others Abnormal  Harmful Behavior to others No threats or harm toward other people  Destructive Behavior No threats or harm toward property

## 2023-01-23 NOTE — [facility] Suicide Risk Assessment (Signed)
[facility] INPATIENT:  Family/Significant Other Suicide Prevention Education  Suicide Prevention Education:  Education Completed; 01-23-2023,  Stacie Eaton ( spouse) [phone] has been identified by the patient as the family member/significant other with whom the patient will be residing, and identified as the person(s) who will aid the patient in the event of a mental health crisis (suicidal ideations/suicide attempt).  With written consent from the patient, the family member/significant other has been provided the following suicide prevention education, prior to the and/or following the discharge of the patient.  The suicide prevention education provided includes the following: Suicide risk factors Suicide prevention and interventions National Suicide Hotline telephone number [facility] assessment telephone number [facility] Emergency Assistance 911 [facility] and/or Residential Mobile Crisis Unit telephone number  Request made of family/significant other to: Remove weapons (e.g., guns, rifles, knives), all items previously/currently identified as safety concern.   Remove drugs/medications (over-the-counter, prescriptions, illicit drugs), all items previously/currently identified as a safety concern.  Stacie Eaton ( spouse) [phone] verbalizes understanding of the suicide prevention education information provided.  The family member/significant other agrees to remove the items of safety concern listed above.  Stacie Eaton 01/23/2023, 10:47 AM

## 2023-01-23 NOTE — [facility] Group Notes (Signed)
Spiritual care group on grief and loss facilitated by Chaplain Katy Jakeya Gherardi, [facility]  Group Goal: Support / Education around grief and loss  Members engage in facilitated group support and psycho-social education.  Group Description:  Following introductions and group rules, group members engaged in facilitated group dialogue and support around topic of loss, with particular support around experiences of loss in their lives. Group Identified types of loss (relationships / self / things) and identified patterns, circumstances, and changes that precipitate losses. Reflected on thoughts / feelings around loss, normalized grief responses, and recognized variety in grief experience. Group encouraged individual reflection on safe space and on the coping skills that they are already utilizing.  Group drew on Adlerian / Rogerian and narrative framework  Patient Progress: Did not attend.  

## 2023-01-23 NOTE — Plan of Care (Signed)
  Problem: Education: Goal: Knowledge of [facility] Education information/materials will improve Outcome: Progressing Goal: Emotional status will improve Outcome: Progressing Goal: Mental status will improve Outcome: Progressing Goal: Verbalization of understanding the information provided will improve Outcome: Progressing   Problem: Coping: Goal: Ability to verbalize frustrations and anger appropriately will improve Outcome: Progressing Goal: Ability to demonstrate self-control will improve Outcome: Progressing   Problem: Activity: Goal: Interest or engagement in leisure activities will improve Outcome: Progressing Goal: Imbalance in normal sleep/wake cycle will improve Outcome: Progressing

## 2023-01-23 NOTE — [facility] Group Notes (Signed)
Adult Psychoeducational Group Note  Date:  01/23/2023 Time:  9:59 AM  Group Topic/Focus:  Goals Group:   The focus of this group is to help patients establish daily goals to achieve during treatment and discuss how the patient can incorporate goal setting into their daily lives to aide in recovery.  Participation Level:  Active  Participation Quality:  Appropriate  Affect:  Appropriate  Cognitive:  Appropriate  Insight: Good  Engagement in Group:  Engaged  Modes of Intervention:  Discussion  Additional Comments:    Stacie Eaton 01/23/2023, 9:59 AM

## 2023-01-23 NOTE — [facility] Suicide Risk Assessment (Addendum)
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  Patient presents voluntarily to [facility] emergency room for worsening depression and anxiety resulting in self-harm of biting, hitting, and forcing herself to vomit in the context of no specific stressors.   Hospital Course: During the patient's hospitalization, patient had extensive initial psychiatric evaluation, and follow-up psychiatric evaluations every day. Psychiatric diagnoses provided upon initial assessment are as listed above. Patient's psychiatric medications were adjusted on admission as follows: Increase olanzapine tablet from 2.5 mg to 5 mg p.o. daily at bedtime for psychosis Initiate Prozac 20 mg tablet p.o. daily for depression Continue hydroxyzine tablet 25 mg p.o. 3 times daily as needed for anxiety Initiate trazodone tablet 50 mg p.o. nightly as needed for insomnia   During the hospitalization, other adjustments were made to the patient's psychiatric medication regimen. Medications at discharge are as follows:  -Continue olanzapine 5 mg p.o. daily at bedtime for mood stabilization-ordered a repeat EKG d/t prolonged QTc and previous EKG along with "right atrial enlargement".  New EKG with QTc less prolonged at 457, as compared to previous one at 483.  hemoglobin A1c wnl. -Continue Prozac 20 mg tablet p.o. daily for depression -Continue Inderal 10 mg TID  for tachycardia/anxiety  Patient's care was discussed during the interdisciplinary team meeting every day during the hospitalization. The patient denies having side effects to prescribed psychiatric medication. Gradually, patient started adjusting to milieu. The patient was evaluated each day by a clinical provider to ascertain response to treatment. Improvement was noted by the patient's report of decreasing symptoms, improved sleep and appetite, affect, medication tolerance, behavior, and participation in unit programming.  Patient was asked each day to complete a self inventory noting mood, mental status, pain, new symptoms, anxiety and concerns.    Symptoms were reported as significantly decreased or resolved completely by discharge. On day of discharge, the patient reports that their mood is stable. The patient denied having suicidal thoughts for more than 48 hours prior to discharge.  Patient denies having homicidal thoughts.  Patient denies having auditory hallucinations.  Patient denies any visual hallucinations or other symptoms of psychosis. The patient was motivated to continue taking medication with a goal of continued improvement in mental health.   The patient reports their target psychiatric symptoms of depression, anxiety, insomnia responded well to the psychiatric medications, and the patient reports overall benefit from this psychiatric hospitalization. Supportive psychotherapy was provided to the patient. The patient also participated in regular group therapy while hospitalized. Coping skills, problem solving as well as relaxation therapies were also part of the unit programming.  Labs were reviewed with the patient, and abnormal results were discussed with the patient.  The patient is able to verbalize their individual safety plan to this provider.  # It is recommended to the patient to continue psychiatric medications as prescribed, after discharge from the hospital.    # It is  recommended to the patient to follow up with your outpatient psychiatric provider and PCP.  # It was discussed with the patient, the impact of alcohol, drugs,  tobacco have been there overall psychiatric and medical wellbeing, and total abstinence from substance use was recommended the patient.ed.  # Prescriptions provided or sent directly to preferred pharmacy at discharge. Patient agreeable to plan. Given opportunity to ask questions. Appears to feel comfortable with discharge.    # In the event of worsening symptoms, the patient is instructed to call the crisis hotline (988), 911 and or go to the nearest ED for appropriate evaluation and treatment of symptoms. To follow-up with primary care provider for other medical issues, concerns and or health care needs  # Patient was discharged home with a plan to follow up as noted below.  Total Time spent with patient: 45 minutes  Musculoskeletal: Strength & Muscle Tone: within normal limits Gait & Station: normal Patient leans: N/A  Psychiatric Specialty Exam  Presentation  General Appearance:  Appropriate for Environment; Fairly Groomed  Eye Contact: Fair  Speech: Clear and Coherent  Speech Volume: Normal  Handedness: Right   Mood and Affect  Mood: Euthymic  Duration of Depression Symptoms: Greater than two weeks  Affect: Congruent   Thought Process  Thought Processes: Coherent  Descriptions of Associations:Intact  Orientation:Full (Time, Place and Person)  Thought Content:Logical  History of Schizophrenia/Schizoaffective disorder:No  Duration of Psychotic Symptoms:No data recorded Hallucinations:Hallucinations: None  Ideas of Reference:None  Suicidal Thoughts:Suicidal Thoughts: No  Homicidal Thoughts:Homicidal Thoughts: No   Sensorium  Memory: Immediate Good  Judgment: Good  Insight: Good   Executive Functions  Concentration: Good  Attention Span: Good  Recall: Good  Fund of  Knowledge: Good  Language: Good   Psychomotor Activity  Psychomotor Activity: Psychomotor Activity: Normal   Assets  Assets: Financial Resources/Insurance   Sleep  Sleep: Sleep: Good   Physical Exam: Physical Exam Constitutional:      Appearance: Normal appearance.  HENT:     Head: Normocephalic.  Eyes:     Pupils: Pupils are equal, round, and reactive to light.  Musculoskeletal:     Cervical back: Normal range of motion.  Neurological:     Mental Status: She is alert and oriented to person, place, and time.    Review of Systems  Constitutional:  Negative for fever.  HENT:  Negative for hearing loss.   Eyes:  Negative for blurred vision.  Respiratory:  Negative for cough.   Cardiovascular:  Negative for chest pain.  Gastrointestinal:  Negative for heartburn.  Genitourinary:  Negative for dysuria.  Musculoskeletal:  Negative for myalgias.  Skin:  Negative for rash.  Neurological:  Negative for dizziness.  Psychiatric/Behavioral:  Positive for depression (resolving, denies SI/HI/AVH, denies any intent/plan to harm self or any one else outside of [place]) and substance abuse (Educated on the negative impact of substance use on her mental health and on the need to stop using and verbalized understanding). Negative for hallucinations, memory loss and suicidal ideas. The patient is nervous/anxious (resolving) and has insomnia (resolving).    Blood pressure 113/72, pulse 85, temperature 98.2 F (36.8 C), temperature source Oral, resp. rate 16, height 5\' 1"  (1.549 m), weight 41.4 kg, last menstrual period 01/15/2023, SpO2 100 %. Body mass index is 17.23 kg/m.  Mental Status Per Nursing Assessment::   On Admission:  Suicidal ideation indicated by patient  Demographic Factors:  Caucasian  Loss Factors: NA  Historical Factors: NA  Risk Reduction Factors:   Responsible for children under 22 years of age, Sense of responsibility to family, Living with another  person, especially a relative, Positive social support, and  Positive therapeutic relationship  Continued Clinical Symptoms:  Anorexia Nervosa More than one psychiatric diagnosis  Cognitive Features That Contribute To Risk:  None    Suicide Risk:  Mild:  There are no identifiable suicide plans, no associated intent, mild dysphoria and related symptoms, good self-control (both objective and subjective assessment), few other risk factors, and identifiable protective factors, including available and accessible social support.    Follow-up Information     [facility]. Go on 02/06/2023.   Specialty: Behavioral Health Why: You have an appointment for medication management services on 02/06/23 at 10:30 am, in person.  You also have an appointment for therapy services on 02/21/23 at 11:00 am, in person. Contact information: [address] [place] [address] [phone]        [facility] Follow up.   Why: You may call this provider to schedule a hospital follow up assessment appointment, so that you may later obtain appointments for therapy and medication management services. Contact information: [address]  [address] [place] [address] [phone]         [facility] .   Contact information: [address] [place] [address] [phone]                 Starleen Blue, NP 01/24/2023, 12:16 PM

## 2023-01-24 DIAGNOSIS — F333 Major depressive disorder, recurrent, severe with psychotic symptoms: Secondary | ICD-10-CM | POA: Diagnosis not present

## 2023-01-24 NOTE — Discharge Summary (Signed)
Physician Discharge Summary Note  Patient:  Stacie Eaton is an 27 y.o., female MRN:  [id number] DOB:  1996/07/21 Patient phone:  [phone] (home)  Patient address:   [address] [place] [address],  Total Time spent with patient: 45 minutes  Date of Admission:  01/19/2023 Date of Discharge: 01/24/2023  Reason for Admission:  As per admissions assessment, "Stacie Eaton is a 27 year old Caucasian female with prior psychiatric diagnoses significant for major depressive disorder, and generalized anxiety. Patient presents voluntarily to [facility] emergency room for worsening depression and anxiety resulting in self-harm of biting, hitting, and forcing herself to vomit in the context of no specific stressors.   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) Active Problems:   Anorexia nervosa, binge-eating purging type  Past Psychiatric History: See H & P  Past Medical History:  Past Medical History:  Diagnosis Date   Anorexia    Anxiety    Bulimia    Depression    postpartum   Urticaria     Past Surgical History:  Procedure Laterality Date   NO PAST SURGERIES     Family History:  Family History  Problem Relation Age of Onset   Allergic rhinitis Neg Hx    Angioedema Neg Hx    Asthma Neg Hx    Eczema Neg Hx    Immunodeficiency Neg Hx    Urticaria Neg Hx    Family Psychiatric  History: See H & P Social History:  Social History   Substance and Sexual Activity  Alcohol Use Not Currently   Comment: rarely     Social History   Substance and Sexual Activity  Drug Use Never    Social History   Socioeconomic History   Marital status: Married    Spouse name: Not on file   Number of children: Not on file   Years of education:  Not on file   Highest education level: Not on file  Occupational History   Not on file  Tobacco Use   Smoking status: Never   Smokeless tobacco: Never  Vaping Use   Vaping Use: Never used  Substance and Sexual Activity   Alcohol use: Not Currently    Comment: rarely   Drug use: Never   Sexual activity: Not Currently    Birth control/protection: Pill  Other Topics Concern   Not on file  Social History Narrative   Not on file   Social Determinants of Health   Financial Resource Strain: Low Risk  (02/18/2019)   Overall Financial Resource Strain (CARDIA)    Difficulty of Paying Living Expenses: Not hard at all  Food Insecurity: No Food Insecurity (01/19/2023)   Hunger Vital Sign    Worried About Running Out of Food in the Last Year: Never true    Ran Out of Food in the Last Year: Never true  Transportation Needs: No Transportation Needs (01/19/2023)   PRAPARE - Administrator, Civil Service (Medical): No    Lack of Transportation (Non-Medical): No  Physical Activity: Not on file  Stress: No Stress Concern Present (02/18/2019)   [employer] of Occupational Health - Occupational Stress Questionnaire    Feeling of Stress : Only a little  Social Connections: Not on file   Hospital Course:   During the patient's hospitalization, patient had extensive initial psychiatric evaluation, and follow-up  psychiatric evaluations every day. Psychiatric diagnoses provided upon initial assessment are as listed above. Patient's psychiatric medications were adjusted on admission as follows: Increase olanzapine tablet from 2.5 mg to 5 mg p.o. daily at bedtime for psychosis Initiate Prozac 20 mg tablet p.o. daily for depression Continue hydroxyzine tablet 25 mg p.o. 3 times daily as needed for anxiety Initiate trazodone tablet 50 mg p.o. nightly as needed for insomnia   During the hospitalization, other adjustments were made to the patient's psychiatric medication regimen.  Medications at discharge are as follows:   -Continue olanzapine 5 mg p.o. daily at bedtime for mood stabilization-ordered a repeat EKG d/t prolonged QTc and previous EKG along with "right atrial enlargement".  New EKG with QTc less prolonged at 457, as compared to previous one at 483.  hemoglobin A1c wnl. -Continue Prozac 20 mg tablet p.o. daily for depression -Continue Inderal 10 mg TID for tachycardia/anxiety   Patient's care was discussed during the interdisciplinary team meeting every day during the hospitalization. The patient denies having side effects to prescribed psychiatric medication. Gradually, patient started adjusting to milieu. The patient was evaluated each day by a clinical provider to ascertain response to treatment. Improvement was noted by the patient's report of decreasing symptoms, improved sleep and appetite, affect, medication tolerance, behavior, and participation in unit programming.  Patient was asked each day to complete a self inventory noting mood, mental status, pain, new symptoms, anxiety and concerns.     Symptoms were reported as significantly decreased or resolved completely by discharge. On day of discharge, the patient reports that their mood is stable. The patient denied having suicidal thoughts for more than 48 hours prior to discharge.  Patient denies having homicidal thoughts.  Patient denies having auditory hallucinations.  Patient denies any visual hallucinations or other symptoms of psychosis. The patient was motivated to continue taking medication with a goal of continued improvement in mental health.    The patient reports their target psychiatric symptoms of depression, anxiety, insomnia responded well to the psychiatric medications, and the patient reports overall benefit from this psychiatric hospitalization. Supportive psychotherapy was provided to the patient. The patient also participated in regular group therapy while hospitalized. Coping skills, problem  solving as well as relaxation therapies were also part of the unit programming.   Labs were reviewed with the patient, and abnormal results were discussed with the patient.   The patient is able to verbalize their individual safety plan to this provider.   # It is recommended to the patient to continue psychiatric medications as prescribed, after discharge from the hospital.     # It is recommended to the patient to follow up with your outpatient psychiatric provider and PCP.   # It was discussed with the patient, the impact of alcohol, drugs, tobacco have been there overall psychiatric and medical wellbeing, and total abstinence from substance use was recommended the patient.ed.   # Prescriptions provided or sent directly to preferred pharmacy at discharge. Patient agreeable to plan. Given opportunity to ask questions. Appears to feel comfortable with discharge.    # In the event of worsening symptoms, the patient is instructed to call the crisis hotline (988), 911 and or go to the nearest ED for appropriate evaluation and treatment of symptoms. To follow-up with primary care provider for other medical issues, concerns and or health care needs   # Patient was discharged home with a plan to follow up as noted below.   Total Time spent with patient: 17  minutes   Physical Findings: AIMS: 0 CIWA:  n/a COWS:  n/a  Musculoskeletal: Strength & Muscle Tone: within normal limits Gait & Station: normal Patient leans: N/A  Psychiatric Specialty Exam:  Presentation  General Appearance:  Appropriate for Environment; Fairly Groomed  Eye Contact: Fair  Speech: Clear and Coherent  Speech Volume: Normal  Handedness: Right   Mood and Affect  Mood: Euthymic  Affect: Congruent   Thought Process  Thought Processes: Coherent  Descriptions of Associations:Intact  Orientation:Full (Time, Place and Person)  Thought Content:Logical  History of Schizophrenia/Schizoaffective  disorder:No  Duration of Psychotic Symptoms:No data recorded Hallucinations:Hallucinations: None  Ideas of Reference:None  Suicidal Thoughts:Suicidal Thoughts: No  Homicidal Thoughts:Homicidal Thoughts: No   Sensorium  Memory: Immediate Good  Judgment: Good  Insight: Good   Executive Functions  Concentration: Good  Attention Span: Good  Recall: Good  Fund of Knowledge: Good  Language: Good   Psychomotor Activity  Psychomotor Activity: Psychomotor Activity: Normal   Assets  Assets: Financial Resources/Insurance   Sleep  Sleep: Sleep: Good    Physical Exam: Physical Exam Constitutional:      Appearance: Normal appearance.  HENT:     Nose: Nose normal. No congestion or rhinorrhea.  Eyes:     Pupils: Pupils are equal, round, and reactive to light.  Pulmonary:     Effort: Pulmonary effort is normal.  Musculoskeletal:     Cervical back: Normal range of motion.  Neurological:     General: No focal deficit present.     Mental Status: She is alert and oriented to person, place, and time.  Psychiatric:        Thought Content: Thought content normal.    Review of Systems  Constitutional:  Negative for fever.  HENT:  Negative for hearing loss.   Eyes:  Negative for blurred vision.  Respiratory:  Negative for cough.   Cardiovascular:  Negative for chest pain.  Gastrointestinal:  Negative for heartburn.  Genitourinary:  Negative for dysuria.  Musculoskeletal:  Negative for myalgias.  Skin:  Negative for rash.  Neurological:  Negative for dizziness.  Psychiatric/Behavioral:  Positive for depression (Resolving, verbally contracts for safety outside of [place]) and substance abuse. Negative for hallucinations, memory loss and suicidal ideas. The patient is nervous/anxious (Resolving) and has insomnia (Resolving).    Blood pressure 113/72, pulse 85, temperature 98.2 F (36.8 C), temperature source Oral, resp. rate 16, height 5\' 1"  (1.549 m),  weight 41.4 kg, last menstrual period 01/15/2023, SpO2 100 %. Body mass index is 17.23 kg/m.   Social History   Tobacco Use  Smoking Status Never  Smokeless Tobacco Never   Tobacco Cessation:  A prescription for an FDA-approved tobacco cessation medication provided at discharge   Blood Alcohol level:  Lab Results  Component Value Date   [facility] <10 01/18/2023    Metabolic Disorder Labs:  Lab Results  Component Value Date   HGBA1C 4.7 (L) 01/22/2023   MPG 88.19 01/22/2023   No results found for: "PROLACTIN" Lab Results  Component Value Date   CHOL 158 01/01/2023   TRIG 30 01/01/2023   HDL 60 01/01/2023   CHOLHDL 2.6 01/01/2023   VLDL 6 01/01/2023   LDLCALC 92 01/01/2023    See Psychiatric Specialty Exam and Suicide Risk Assessment completed by Attending Physician prior to discharge.  Discharge destination:  Home  Is patient on multiple antipsychotic therapies at discharge:  No   Has Patient had three or more failed trials of antipsychotic monotherapy by history:  No  Recommended Plan for Multiple Antipsychotic Therapies: NA   Allergies as of 01/24/2023   No Known Allergies      Medication List     STOP taking these medications    EPINEPHrine 0.3 mg/0.3 mL Soaj injection Commonly known as: EPI-PEN       TAKE these medications      Indication  FLUoxetine 20 MG capsule Commonly known as: PROZAC Take 1 capsule (20 mg total) by mouth daily.  Indication: Depression, Generalized Anxiety Disorder   nicotine 21 mg/24hr patch Commonly known as: NICODERM CQ - dosed in mg/24 hours Place 1 patch (21 mg total) onto the skin daily.  Indication: Nicotine Addiction   norgestimate-ethinyl estradiol 0.25-35 MG-MCG tablet Commonly known as: ORTHO-CYCLEN Take 1 tablet by mouth daily.  Indication: Birth Control Treatment   OLANZapine 5 MG tablet Commonly known as: ZYPREXA Take 1 tablet (5 mg total) by mouth at bedtime.  Indication: Psychotic Depressive  Illness   propranolol 10 MG tablet Commonly known as: INDERAL Take 1 tablet (10 mg total) by mouth 2 (two) times daily.  Indication: Feeling Anxious, tachycardia/anxiety        Follow-up Information     [facility]. Go on 02/06/2023.   Specialty: Behavioral Health Why: You have an appointment for medication management services on 02/06/23 at 10:30 am, in person.  You also have an appointment for therapy services on 02/21/23 at 11:00 am, in person. Contact information: [address] [place] [address] [phone]        [facility] Follow up.   Why: You may call this provider to schedule a hospital follow up assessment appointment, so that you may later obtain appointments for therapy and medication management services. Contact information: [address]  [address] [place] [address] [phone]         [facility] .   Contact information: [address] [place] [address] [phone]                Signed: Starleen Blue, NP 01/24/2023, 1:08 PM

## 2023-01-24 NOTE — Group Note (Signed)
Recreation Therapy Group Note   Group Topic:Animal Assisted Therapy   Group Date: 01/24/2023 Start Time: 0950 End Time: 1030 Facilitators: Bryten Maher-McCall, LRT,CTRS Location: [address]   Animal-Assisted Activity (AAA) Program Checklist/Progress Notes Patient Eligibility Criteria Checklist & Daily Group note for Rec Tx Intervention  AAA/T Program Assumption of Risk Form signed by Patient/ or Parent Legal Guardian Yes  Patient is free of allergies or severe asthma Yes  Patient reports no fear of animals Yes  Patient reports no history of cruelty to animals Yes  Patient understands his/her participation is voluntary Yes  Patient washes hands before animal contact Yes  Patient washes hands after animal contact Yes   Affect/Mood: Appropriate   Participation Level: Engaged   Participation Quality: Independent   Behavior: Appropriate    Clinical Observations/Individualized Feedback:  Patient attended session and interacted appropriately with therapy dog and peers. Patient asked appropriate questions about therapy dog and his training. Patient shared stories about their pets at home with group.    Plan: Continue to engage patient in RT group sessions 2-3x/week.   Leisa Gault-McCall, LRT,CTRS 01/24/2023 1:16 PM

## 2023-01-24 NOTE — Progress Notes (Signed)
D: Pt A & O X 4. Denies SI, HI, AVH and pain at this time. D/C home as ordered. Picked up in lobby by her husband. A: D/C instructions reviewed with pt including prescriptions, medication samples and follow up appointment; compliance encouraged. All belongings from assigned locker returned to pt at time of departure. Scheduled medications given with verbal education and effects monitored. Safety checks maintained without incident till time of d/c.  R: Pt receptive to care. Compliant with medications when offered. Denies adverse drug reactions when assessed. Pt verbalized understanding related to d/c instructions. Signed belonging sheet in agreement with items received from locker. Ambulatory with a steady gait. Appears to be in no physical distress at time of departure.

## 2023-01-24 NOTE — Group Note (Signed)
Date:  01/24/2023 Time:  12:10 PM  Group Topic/Focus:  Rediscovering Joy:   The focus of this group is to explore various ways to relieve stress in a positive manner.    Participation Level:  Active  Participation Quality:  Appropriate  Affect:  Appropriate  Cognitive:  Appropriate  Insight: Appropriate  Engagement in Group:  Engaged  Modes of Intervention:  Discussion, Rapport Building, Socialization, and Support  Additional Comments:    Memory Dance Kongmeng Santoro 01/24/2023, 12:10 PM

## 2023-01-24 NOTE — Plan of Care (Signed)
  Problem: Education: Goal: Knowledge of [facility] Education information/materials will improve Outcome: Adequate for Discharge Goal: Emotional status will improve Outcome: Adequate for Discharge Goal: Mental status will improve Outcome: Adequate for Discharge Goal: Verbalization of understanding the information provided will improve Outcome: Adequate for Discharge   Problem: Health Behavior/Discharge Planning: Goal: Identification of resources available to assist in meeting health care needs will improve Outcome: Adequate for Discharge Goal: Compliance with treatment plan for underlying cause of condition will improve Outcome: Adequate for Discharge   Problem: Activity: Goal: Interest or engagement in leisure activities will improve Outcome: Adequate for Discharge Goal: Imbalance in normal sleep/wake cycle will improve Outcome: Adequate for Discharge

## 2023-01-24 NOTE — [facility] Group Notes (Signed)
The focus of this group is to help patients establish daily goals to achieve during treatment and discuss how the patient can incorporate goal setting into their daily lives to aide in recovery.     Scale 1-10 7 out 10     Goal: Work on discharge

## 2023-01-24 NOTE — Progress Notes (Signed)
  [facility] Adult Case Management Discharge Plan :  Will you be returning to the same living situation after discharge:  Yes,  Sonny Masters ( spouse) [phone] At discharge, do you have transportation home?: Yes,  Sonny Masters ( spouse) [phone] Do you have the ability to pay for your medications: Yes,  Insured  Release of information consent forms completed and in the chart;  Patient's signature needed at discharge.  Patient to Follow up at:  Follow-up Information     [facility] Go on 02/06/2023.   Specialty: Behavioral Health Why: You have an appointment for medication management services on 02/06/23 at 10:30 am, in person.  You also have an appointment for therapy services on 02/21/23 at 11:00 am, in person. Contact information: [address] [place] [address] [phone]        [facility] Follow up.   Why: You may call this provider to schedule a hospital follow up assessment appointment, so that you may later obtain appointments for therapy and medication management services. Contact information: [address]  [address] [place] [address] [phone]         [facility] .   Contact information: [address] [place] [address] [phone]                 Next level of care provider has access to [facility] Link:yes  Safety Planning and Suicide Prevention discussed: Yes,  Sonny Masters ( spouse) [phone]     Has patient been referred to the Quitline?: Patient does not use tobacco/nicotine products  Patient has been referred for addiction treatment: Yes, the patient will follow up with an outpatient provider for substance use disorder. Psychiatrist/APP: appointment made and Therapist: appointment made Patient to continue working towards treatment goals after discharge. Patient no longer meets criteria for inpatient criteria per attending physician. Continue taking medications as prescribed, nursing  to provide instructions at discharge. Follow up with all scheduled appointments.   Clarita Mcelvain S Hilma Steinhilber, LCSW 01/24/2023, 9:44 AM

## 2023-01-30 ENCOUNTER — Telehealth: Payer: Self-pay

## 2023-01-30 NOTE — Telephone Encounter (Signed)
..  Patient declines further follow up and engagement by the Managed Medicaid Team. Appropriate care team members and provider have been notified via electronic communication. The Managed Medicaid Team is available to follow up with the patient after provider conversation with the patient regarding recommendation for engagement and subsequent re-referral to the Managed Medicaid Team.     Kristianne Albin Care Guide  Medicaid Managed  Care Guide [place]  [phone]  

## 2023-02-06 ENCOUNTER — Encounter (HOSPITAL_COMMUNITY): Payer: Self-pay | Admitting: Psychiatry

## 2023-02-06 ENCOUNTER — Other Ambulatory Visit: Payer: Self-pay

## 2023-02-06 ENCOUNTER — Ambulatory Visit (INDEPENDENT_AMBULATORY_CARE_PROVIDER_SITE_OTHER): Payer: Medicaid Other | Admitting: Psychiatry

## 2023-02-06 VITALS — BP 113/74 | HR 73 | Temp 97.7°F | Wt 105.6 lb

## 2023-02-06 DIAGNOSIS — F333 Major depressive disorder, recurrent, severe with psychotic symptoms: Secondary | ICD-10-CM

## 2023-02-06 DIAGNOSIS — F411 Generalized anxiety disorder: Secondary | ICD-10-CM | POA: Diagnosis not present

## 2023-02-06 DIAGNOSIS — Z419 Encounter for procedure for purposes other than remedying health state, unspecified: Secondary | ICD-10-CM | POA: Diagnosis not present

## 2023-02-06 MED ORDER — HYDROXYZINE HCL 25 MG PO TABS
25.0000 mg | ORAL_TABLET | Freq: Three times a day (TID) | ORAL | 3 refills | Status: DC | PRN
Start: 2023-02-06 — End: 2023-04-30
  Filled 2023-02-06: qty 90, 30d supply, fill #0
  Filled 2023-04-03: qty 90, 30d supply, fill #1

## 2023-02-06 MED ORDER — PROPRANOLOL HCL 10 MG PO TABS
10.0000 mg | ORAL_TABLET | Freq: Two times a day (BID) | ORAL | 3 refills | Status: AC
Start: 2023-02-06 — End: ?
  Filled 2023-02-06: qty 60, 30d supply, fill #0
  Filled 2023-04-03: qty 60, 30d supply, fill #1

## 2023-02-06 MED ORDER — OLANZAPINE 5 MG PO TABS
5.0000 mg | ORAL_TABLET | Freq: Every day | ORAL | 3 refills | Status: DC
Start: 2023-02-06 — End: 2023-04-24
  Filled 2023-02-06: qty 30, 30d supply, fill #0
  Filled 2023-04-03: qty 30, 30d supply, fill #1

## 2023-02-06 MED ORDER — FLUOXETINE HCL 20 MG PO CAPS
20.0000 mg | ORAL_CAPSULE | Freq: Every day | ORAL | 3 refills | Status: DC
Start: 2023-02-06 — End: 2023-04-30
  Filled 2023-02-06: qty 30, 30d supply, fill #0
  Filled 2023-04-03: qty 30, 30d supply, fill #1
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
## 2023-02-21 ENCOUNTER — Telehealth (HOSPITAL_COMMUNITY): Payer: Self-pay | Admitting: Licensed Clinical Social Worker

## 2023-02-21 ENCOUNTER — Ambulatory Visit (INDEPENDENT_AMBULATORY_CARE_PROVIDER_SITE_OTHER): Payer: Medicaid Other | Admitting: Licensed Clinical Social Worker

## 2023-02-21 DIAGNOSIS — F321 Major depressive disorder, single episode, moderate: Secondary | ICD-10-CM | POA: Diagnosis not present
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
## 2023-02-23 ENCOUNTER — Ambulatory Visit (INDEPENDENT_AMBULATORY_CARE_PROVIDER_SITE_OTHER): Payer: Medicaid Other | Admitting: Nurse Practitioner

## 2023-02-23 VITALS — BP 104/57 | HR 85 | Temp 97.3°F | Wt 115.2 lb

## 2023-02-23 DIAGNOSIS — N926 Irregular menstruation, unspecified: Secondary | ICD-10-CM | POA: Diagnosis not present

## 2023-02-23 DIAGNOSIS — F333 Major depressive disorder, recurrent, severe with psychotic symptoms: Secondary | ICD-10-CM | POA: Diagnosis not present

## 2023-02-23 NOTE — Assessment & Plan Note (Signed)
-  continue to follow with psychiatry  2. Abnormal menstrual periods  -resolved  Follow up:  Follow up in 6 months or sooner if needed

## 2023-02-23 NOTE — Progress Notes (Signed)
@Patient  ID: Stacie Eaton, female    DOB: 1996-04-08, 27 y.o.   MRN: [id number]  Chief Complaint  Patient presents with   Follow-up    Frequent cycles has cleared up since b/c change     Referring provider: Ivonne Andrew, NP   HPI  Patient presents today for 52-month follow-up.  She was having irregular cycles and was started on birth control at her last visit here.  She states that overall her menstrual cycles are normal today.  Since her last visit here patient has been in the hospital for suicidal thoughts.  She has been placed on medication and is followed by psychiatry now.  She states that her mood is much improved.  She states that overall she is doing well. Denies f/c/s, n/v/d, hemoptysis, PND, leg swelling Denies chest pain or edema     No Known Allergies  There is no immunization history for the selected administration types on file for this patient.  Past Medical History:  Diagnosis Date   Anorexia    Anxiety    Bulimia    Depression    postpartum   Urticaria     Tobacco History: Social History   Tobacco Use  Smoking Status Never  Smokeless Tobacco Never   Counseling given: Not Answered   Outpatient Encounter Medications as of 02/23/2023  Medication Sig   FLUoxetine (PROZAC) 20 MG capsule Take 1 capsule (20 mg total) by mouth daily.   hydrOXYzine (ATARAX) 25 MG tablet Take 1 tablet (25 mg total) by mouth 3 (three) times daily as needed.   norgestimate-ethinyl estradiol (ORTHO-CYCLEN) 0.25-35 MG-MCG tablet Take 1 tablet by mouth daily.   OLANZapine (ZYPREXA) 5 MG tablet Take 1 tablet (5 mg total) by mouth at bedtime.   propranolol (INDERAL) 10 MG tablet Take 1 tablet (10 mg total) by mouth 2 (two) times daily.   No facility-administered encounter medications on file as of 02/23/2023.     Review of Systems  Review of Systems  Constitutional: Negative.   HENT: Negative.    Cardiovascular: Negative.   Gastrointestinal: Negative.    Allergic/Immunologic: Negative.   Neurological: Negative.   Psychiatric/Behavioral: Negative.         Physical Exam  BP (!) 104/57   Pulse 85   Temp (!) 97.3 F (36.3 C)   Wt 115 lb 3.2 oz (52.3 kg)   SpO2 100%   BMI 21.77 kg/m   Wt Readings from Last 5 Encounters:  02/23/23 115 lb 3.2 oz (52.3 kg)  01/18/23 95 lb 0.3 oz (43.1 kg)  12/22/22 95 lb (43.1 kg)  09/21/22 100 lb (45.4 kg)  07/20/19 120 lb (54.4 kg)     Physical Exam Vitals and nursing note reviewed.  Constitutional:      General: She is not in acute distress.    Appearance: She is well-developed.  Cardiovascular:     Rate and Rhythm: Normal rate and regular rhythm.  Pulmonary:     Effort: Pulmonary effort is normal.     Breath sounds: Normal breath sounds.  Neurological:     Mental Status: She is alert and oriented to person, place, and time.      Lab Results:  CBC    Component Value Date/Time   WBC 9.7 01/18/2023 1750   RBC 5.15 (H) 01/18/2023 1750   HGB 14.8 01/18/2023 1750   HGB 14.0 12/22/2022 0902   HCT 44.9 01/18/2023 1750   HCT 43.9 12/22/2022 0902   PLT 197 01/18/2023 1750  PLT 144 (L) 12/22/2022 0902   MCV 87.2 01/18/2023 1750   MCV 92 12/22/2022 0902   MCH 28.7 01/18/2023 1750   MCHC 33.0 01/18/2023 1750   RDW 11.9 01/18/2023 1750   RDW 12.1 12/22/2022 0902   LYMPHSABS 1.2 01/01/2023 1737   MONOABS 0.3 01/01/2023 1737   EOSABS 0.0 01/01/2023 1737   BASOSABS 0.0 01/01/2023 1737    BMET    Component Value Date/Time   NA 136 01/18/2023 1750   NA 138 12/22/2022 0902   K 3.6 01/18/2023 1750   CL 104 01/18/2023 1750   CO2 22 01/18/2023 1750   GLUCOSE 89 01/18/2023 1750   BUN 11 01/18/2023 1750   BUN 10 12/22/2022 0902   CREATININE 0.58 01/18/2023 1750   CREATININE 0.60 07/13/2016 1013   CALCIUM 9.6 01/18/2023 1750   GFRNONAA >60 01/18/2023 1750   GFRNONAA >89 07/13/2016 1013   GFRAA >60 07/20/2019 1010   GFRAA >89 07/13/2016 1013      Assessment & Plan:    Major depressive disorder, 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 Abnormal menstrual periods  -resolved  Follow up:  Follow up in 6 months or sooner if needed     Ivonne Andrew, NP 02/23/2023

## 2023-02-23 NOTE — Patient Instructions (Signed)
1. 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 Abnormal menstrual periods  -resolved  Follow up:  Follow up in 6 months or sooner if needed

## 2023-03-09 ENCOUNTER — Ambulatory Visit: Payer: Self-pay | Admitting: Nurse Practitioner

## 2023-03-30 ENCOUNTER — Encounter (HOSPITAL_COMMUNITY): Payer: Self-pay

## 2023-03-30 ENCOUNTER — Encounter (HOSPITAL_COMMUNITY): Payer: 59 | Admitting: Student

## 2023-04-03 ENCOUNTER — Other Ambulatory Visit (HOSPITAL_BASED_OUTPATIENT_CLINIC_OR_DEPARTMENT_OTHER): Payer: Self-pay

## 2023-04-14 ENCOUNTER — Encounter (HOSPITAL_COMMUNITY): Payer: Medicaid Other | Admitting: Student

## 2023-04-23 ENCOUNTER — Ambulatory Visit (HOSPITAL_COMMUNITY)
Admission: EM | Admit: 2023-04-23 | Discharge: 2023-04-24 | Disposition: A | Discharge status: Other Acute Inpatient Hospital | Payer: MEDICAID | Attending: Psychiatry | Admitting: Psychiatry

## 2023-04-23 DIAGNOSIS — F332 Major depressive disorder, recurrent severe without psychotic features: Secondary | ICD-10-CM | POA: Insufficient documentation

## 2023-04-23 DIAGNOSIS — R45851 Suicidal ideations: Secondary | ICD-10-CM | POA: Insufficient documentation

## 2023-04-23 DIAGNOSIS — F322 Major depressive disorder, single episode, severe without psychotic features: Secondary | ICD-10-CM | POA: Insufficient documentation

## 2023-04-23 DIAGNOSIS — Z9152 Personal history of nonsuicidal self-harm: Secondary | ICD-10-CM | POA: Insufficient documentation

## 2023-04-23 DIAGNOSIS — Z79899 Other long term (current) drug therapy: Secondary | ICD-10-CM | POA: Insufficient documentation

## 2023-04-23 DIAGNOSIS — F419 Anxiety disorder, unspecified: Secondary | ICD-10-CM | POA: Insufficient documentation

## 2023-04-23 LAB — CBC WITH DIFFERENTIAL/PLATELET
Abs Immature Granulocytes: 0.02 10*3/uL (ref 0.00–0.07)
Basophils Absolute: 0 10*3/uL (ref 0.0–0.1)
Basophils Relative: 0 %
Eosinophils Absolute: 0.1 10*3/uL (ref 0.0–0.5)
Eosinophils Relative: 2 %
HCT: 40.4 % (ref 36.0–46.0)
Hemoglobin: 13.2 g/dL (ref 12.0–15.0)
Immature Granulocytes: 0 %
Lymphocytes Relative: 27 %
Lymphs Abs: 1.5 10*3/uL (ref 0.7–4.0)
MCH: 28.7 pg (ref 26.0–34.0)
MCHC: 32.7 g/dL (ref 30.0–36.0)
MCV: 87.8 fL (ref 80.0–100.0)
Monocytes Absolute: 0.3 10*3/uL (ref 0.1–1.0)
Monocytes Relative: 6 %
Neutro Abs: 3.5 10*3/uL (ref 1.7–7.7)
Neutrophils Relative %: 65 %
Platelets: 222 10*3/uL (ref 150–400)
RBC: 4.6 MIL/uL (ref 3.87–5.11)
RDW: 11.7 % (ref 11.5–15.5)
WBC: 5.4 10*3/uL (ref 4.0–10.5)
nRBC: 0 % (ref 0.0–0.2)

## 2023-04-23 LAB — COMPREHENSIVE METABOLIC PANEL
ALT: 16 U/L (ref 0–44)
AST: 17 U/L (ref 15–41)
Albumin: 3.8 g/dL (ref 3.5–5.0)
Alkaline Phosphatase: 52 U/L (ref 38–126)
Anion gap: 9 (ref 5–15)
BUN: 7 mg/dL (ref 6–20)
CO2: 26 mmol/L (ref 22–32)
Calcium: 9.3 mg/dL (ref 8.9–10.3)
Chloride: 102 mmol/L (ref 98–111)
Creatinine, Ser: 0.6 mg/dL (ref 0.44–1.00)
GFR, Estimated: >60 mL/min (ref >60)
Glucose, Bld: 105 mg/dL — ABNORMAL HIGH (ref 70–99)
Potassium: 3.7 mmol/L (ref 3.5–5.1)
Sodium: 137 mmol/L (ref 135–145)
Total Bilirubin: 0.5 mg/dL (ref 0.3–1.2)
Total Protein: 7 g/dL (ref 6.5–8.1)

## 2023-04-23 LAB — POCT URINE DRUG SCREEN - MANUAL ENTRY (I-SCREEN)
POC Amphetamine UR: NOT DETECTED
POC Buprenorphine (BUP): NOT DETECTED
POC Cocaine UR: NOT DETECTED
POC Marijuana UR: NOT DETECTED
POC Methadone UR: NOT DETECTED
POC Methamphetamine UR: NOT DETECTED
POC Morphine: NOT DETECTED
POC Oxazepam (BZO): NOT DETECTED
POC Oxycodone UR: NOT DETECTED
POC Secobarbital (BAR): NOT DETECTED

## 2023-04-23 LAB — URINALYSIS, COMPLETE (UACMP) WITH MICROSCOPIC
Bilirubin Urine: NEGATIVE
Glucose, UA: NEGATIVE mg/dL
Ketones, ur: NEGATIVE mg/dL
Nitrite: NEGATIVE
Protein, ur: NEGATIVE mg/dL
RBC / HPF: >50 RBC/hpf (ref 0–5)
Specific Gravity, Urine: 1.01 (ref 1.005–1.030)
pH: 6.5 (ref 5.0–8.0)

## 2023-04-23 LAB — POC URINE PREG, ED: Preg Test, Ur: NEGATIVE

## 2023-04-23 MED ORDER — MAGNESIUM HYDROXIDE 400 MG/5ML PO SUSP: 30.0000 mL | Freq: Every day | ORAL | Status: DC | PRN

## 2023-04-23 MED ORDER — ARIPIPRAZOLE 2 MG PO TABS
2.0000 mg | ORAL_TABLET | Freq: Every day | ORAL | Status: DC
  Administered 2023-04-23: 2 mg via ORAL
  Filled 2023-04-23: qty 1

## 2023-04-23 MED ORDER — FLUOXETINE HCL 20 MG PO CAPS
20.0000 mg | ORAL_CAPSULE | Freq: Every day | ORAL | Status: DC
  Administered 2023-04-24: 20 mg via ORAL
  Filled 2023-04-23: qty 1

## 2023-04-23 MED ORDER — ACETAMINOPHEN 325 MG PO TABS: 650.0000 mg | ORAL_TABLET | Freq: Four times a day (QID) | ORAL | Status: DC | PRN

## 2023-04-23 MED ORDER — HYDROXYZINE HCL 25 MG PO TABS
25.0000 mg | ORAL_TABLET | Freq: Three times a day (TID) | ORAL | Status: DC | PRN
  Administered 2023-04-23: 25 mg via ORAL
  Filled 2023-04-23: qty 1

## 2023-04-23 MED ORDER — PROPRANOLOL HCL 10 MG PO TABS
10.0000 mg | ORAL_TABLET | Freq: Two times a day (BID) | ORAL | Status: DC
  Administered 2023-04-23 – 2023-04-24 (×2): 10 mg via ORAL
  Filled 2023-04-23 (×2): qty 1

## 2023-04-23 MED ORDER — ALUM & MAG HYDROXIDE-SIMETH 200-200-20 MG/5ML PO SUSP: 30.0000 mL | ORAL | Status: DC | PRN

## 2023-04-23 MED ORDER — TRAZODONE HCL 50 MG PO TABS
50.0000 mg | ORAL_TABLET | Freq: Every evening | ORAL | Status: DC | PRN
  Administered 2023-04-23: 50 mg via ORAL
  Filled 2023-04-23: qty 1

## 2023-04-23 NOTE — ED Notes (Signed)
Pt  admitted to obs. Reports passive   SI/ deniesHI/AVH. Calm, cooperative throughout interview process. Skin assessment completed. Oriented to unit. Meal and drink offered. At currrent, pt continue to endorse passive  SI/ denies HI/AVH. Pt verbally contract for safety. Will monitor for safety.

## 2023-04-23 NOTE — Progress Notes (Signed)
   04/23/23 1705  [facility] Triage Screening (Walk-ins at [facility] only)  How Did You Hear About [place]? Legal System  What Is the Reason for Your Visit/Call Today? Pt is a 27 year old female who presents voluntarily accompanied by [facility] due to SI, without intent or plan. Patient also  reports cutting herself this morning. She has superficial cuts on her left wrist.  Pt denies  HI/AVS.  Patient denies substance use aut endorses occassional alsochol use.  How Long Has This Been Causing You Problems? 1 wk - 1 month  Have You Recently Had Any Thoughts About Hurting Yourself? Yes  How long ago did you have thoughts about hurting yourself? Today  Are You Planning to Commit Suicide/Harm Yourself At This time? No  Have you Recently Had Thoughts About Hurting Someone Karolee Ohs? No  Are You Planning To Harm Someone At This Time? No  Have You Used Any Alcohol or Drugs in the Past 24 Hours? No  Do you have any current medical co-morbidities that require immediate attention? No  Clinician description of patient physical appearance/behavior: casually dressed, alert and oriented x3 spoft spoken, coopertaive  What Do You Feel Would Help You the Most Today? Treatment for Depression or other mood problem  If access to [facility] was not available, would you have sought care in the Emergency Department? No  Determination of Need Urgent (48 hours)  Options For Referral Outpatient Therapy;Medication Management

## 2023-04-23 NOTE — ED Notes (Signed)
Pt A&O x 4, sleeping at present, no distress noted, monitoring for safety.

## 2023-04-23 NOTE — ED Notes (Signed)
Provider is aware of the patient blood pressure being 89/61 pulse 105. It was rechecked manually and it was 108/72 pulse 105. Patient denies any s/s of hypotension ,will continue to monitor for safety.States  that she may be dehydrated.
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
## 2023-04-24 ENCOUNTER — Other Ambulatory Visit: Payer: Self-pay

## 2023-04-24 ENCOUNTER — Inpatient Hospital Stay (HOSPITAL_COMMUNITY)
Admission: AD | Admit: 2023-04-24 | Discharge: 2023-04-30 | DRG: 885 | Disposition: A | Discharge status: Home / Self Care | Payer: MEDICAID | Source: Intra-hospital | Attending: Psychiatry | Admitting: Psychiatry

## 2023-04-24 ENCOUNTER — Encounter (HOSPITAL_COMMUNITY): Payer: Self-pay | Admitting: Psychiatry

## 2023-04-24 DIAGNOSIS — F064 Anxiety disorder due to known physiological condition: Secondary | ICD-10-CM | POA: Diagnosis present

## 2023-04-24 DIAGNOSIS — Z23 Encounter for immunization: Secondary | ICD-10-CM

## 2023-04-24 DIAGNOSIS — R000 Tachycardia, unspecified: Secondary | ICD-10-CM | POA: Diagnosis present

## 2023-04-24 DIAGNOSIS — F332 Major depressive disorder, recurrent severe without psychotic features: Principal | ICD-10-CM | POA: Diagnosis present

## 2023-04-24 DIAGNOSIS — F411 Generalized anxiety disorder: Secondary | ICD-10-CM | POA: Diagnosis present

## 2023-04-24 DIAGNOSIS — Z79899 Other long term (current) drug therapy: Secondary | ICD-10-CM

## 2023-04-24 DIAGNOSIS — R45851 Suicidal ideations: Secondary | ICD-10-CM | POA: Diagnosis present

## 2023-04-24 DIAGNOSIS — F22 Delusional disorders: Secondary | ICD-10-CM | POA: Diagnosis present

## 2023-04-24 DIAGNOSIS — F322 Major depressive disorder, single episode, severe without psychotic features: Secondary | ICD-10-CM | POA: Insufficient documentation

## 2023-04-24 DIAGNOSIS — Z560 Unemployment, unspecified: Secondary | ICD-10-CM | POA: Diagnosis not present

## 2023-04-24 MED ORDER — INFLUENZA VIRUS VACC SPLIT PF (FLUZONE) 0.5 ML IM SUSY
0.5000 mL | PREFILLED_SYRINGE | INTRAMUSCULAR | Status: AC
  Administered 2023-04-25: 0.5 mL via INTRAMUSCULAR
  Filled 2023-04-24: qty 0.5

## 2023-04-24 MED ORDER — ARIPIPRAZOLE 2 MG PO TABS: 2.0000 mg | ORAL_TABLET | Freq: Every day | ORAL | Status: DC

## 2023-04-24 NOTE — Progress Notes (Signed)
Pt has been accepted to [facility] TODAY 04/24/2023, pending voluntary consent. Bed assignment: 301-1  Pt meets inpatient criteria per Vernard Gambles, NP  Attending Physician will be Phineas Inches, MD  Report can be called to: - Adult unit: [phone]  Pt can arrive after pending items are received  Care Team Notified: [facility] Rona Ravens, RN, Vernard Gambles, NP, and Cleotis Lema, RN  [place], [place]  04/24/2023 10:19 AM

## 2023-04-24 NOTE — Progress Notes (Signed)
Low risk

## 2023-04-24 NOTE — Discharge Instructions (Signed)
Transfer patient to [facility], Dr. Florene Route is excepting MD

## 2023-04-24 NOTE — B[facility] Group Notes (Signed)
[facility] Group Notes:  (Nursing/MHT/Case Management/Adjunct)  Adult Psychoeducational Group Note  Date:  04/24/2023 Time:  1:53 PM  Group Topic/Focus:  Emotional Education:   The focus of this group is to discuss what feelings/emotions are, and how they are experienced.  Participation Level:  Active  Participation Quality:  Appropriate  Affect:  Appropriate  Cognitive:  Appropriate  Insight: Appropriate  Engagement in Group:  Engaged  Modes of Intervention:  Clarification and Confrontation  Additional Comments:    Lucilla Edin 04/24/2023, 1:53 PM

## 2023-04-24 NOTE — ED Notes (Signed)
Report called to [place] [place], Charity fundraiser. Called safe transport for pickup.

## 2023-04-24 NOTE — BHH Group Notes (Signed)
[facility] Group Notes:  (Nursing/MHT/Case Management/Adjunct)  Date:  04/24/2023  Time:  9:38 PM  Type of Therapy:  Group Therapy  Participation Level:  Did Not Attend  Participation Quality:   n/a  Affect:   n/a  Cognitive:   n/a  Insight:  None  Engagement in Group:   n/a  Modes of Intervention:   n/a  Summary of Progress/Problems: Pt did not attend  Stacie Eaton E Becky Colan 04/24/2023, 9:38 PM

## 2023-04-24 NOTE — Progress Notes (Signed)
   04/24/23 2200  Psych Admission Type (Psych Patients Only)  Admission Status Voluntary  Psychosocial Assessment  Patient Complaints Depression  Eye Contact Brief  Facial Expression Anxious  Affect Sad  Speech Logical/coherent  Interaction Minimal  Motor Activity Slow  Appearance/Hygiene Unremarkable  Behavior Characteristics Appropriate to situation  Mood Depressed  Thought Process  Coherency WDL  Content WDL  Delusions None reported or observed  Perception WDL  Hallucination None reported or observed  Judgment Impaired  Confusion None  Danger to Self  Current suicidal ideation? Denies  Self-Injurious Behavior No self-injurious ideation or behavior indicators observed or expressed   Agreement Not to Harm Self Yes  Description of Agreement verbal  Danger to Others  Danger to Others None reported or observed

## 2023-04-24 NOTE — ED Notes (Signed)
Pt sleeping at present, no distress noted.  Monitoring for safety. 

## 2023-04-24 NOTE — Plan of Care (Signed)

## 2023-04-24 NOTE — ED Provider Notes (Signed)
FBC/OBS ASAP Discharge Summary  Date and Time: 04/24/2023 2:37 PM  Name: Stacie Eaton  MRN:  [id number]   Discharge Diagnoses:  Final diagnoses:  Severe episode of recurrent major depressive disorder, without psychotic features (HCC)   HPI: patient presented to [facility] as a walk in accompanied by [employer] BHART with complaints of increased depression, suicidal ideations and self-harm.   Stacie Eaton, 27 y.o., female patient seen face to face by this provider and chart reviewed on 04/23/23.  Per chart review patient has a past psychiatric history of MDD, anxiety and anorexia nervosa, binge eating purging type.    Patient was admitted to the continuous unit while awaiting IP bed availability.   Patient was seen face to face by this provider, chart reviewed, and case consulted with Dr. Lucianne Muss on 04/24/2023.   Subjective:   On today's assessment patient comes observed laying in the bed awake.  She reports nothing has changed.  She continues to endorse depression and has a depressed affect.  She continues to endorse suicidal ideations and cannot contract for safety.  She is denying HI/AVH.  She does not appear to be responding to internal/external stimuli.  Reports it was difficult to sleep on the unit throughout the night due to noise and lights.  Stay Summary:   Patient remained calm and cooperative on the unit.  She was appropriate with staff and other patients.  She required no as needed medications for agitation.  She has been accepted to [facility] for inpatient psychiatric mission and patient is in agreement.  She will remain voluntary at this time  Total Time spent with patient: 30 minutes  Past Psychiatric History: see h&p Past Medical History: see h&p Family History: see h&p Family Psychiatric History: see h&p Social History: see h&p Tobacco Cessation:  N/A, patient does not currently use tobacco products  Current Medications:  No current facility-administered medications for this  encounter.   No current outpatient medications on file.    PTA Medications:  PTA Medications  Medication Sig   norgestimate-ethinyl estradiol (ORTHO-CYCLEN) 0.25-35 MG-MCG tablet Take 1 tablet by mouth daily.   FLUoxetine (PROZAC) 20 MG capsule Take 1 capsule (20 mg total) by mouth daily.   propranolol (INDERAL) 10 MG tablet Take 1 tablet (10 mg total) by mouth 2 (two) times daily.   hydrOXYzine (ATARAX) 25 MG tablet Take 1 tablet (25 mg total) by mouth 3 (three) times daily as needed. (Patient taking differently: Take 25 mg by mouth 3 (three) times daily as needed for anxiety.)   ARIPiprazole (ABILIFY) 2 MG tablet Take 1 tablet (2 mg total) by mouth at bedtime.       02/23/2023    8:22 AM 02/21/2023   10:58 AM 02/06/2023   10:28 AM  Depression screen PHQ 2/9  Decreased Interest 0 1 0  Down, Depressed, Hopeless 1 1 1   PHQ - 2 Score 1 2 1   Altered sleeping  0 1  Tired, decreased energy  1 0  Change in appetite  0 0  Feeling bad or failure about yourself   0 0  Trouble concentrating  0 0  Moving slowly or fidgety/restless  0 0  Suicidal thoughts  0 0  PHQ-9 Score  3 2  Difficult doing work/chores  Very difficult     Flowsheet Row ED from 04/23/2023 in [facility] Counselor from 02/21/2023 in [facility] Office Visit from 02/06/2023 in [facility]  C-SSRS  RISK CATEGORY High Risk High Risk Error: Q3, 4, or 5 should not be populated when Q2 is No       Musculoskeletal  Strength & Muscle Tone: within normal limits Gait & Station: normal Patient leans: N/A  Psychiatric Specialty Exam  Presentation  General Appearance:  Disheveled  Eye Contact: Fair  Speech: Clear and Coherent; Normal Rate  Speech Volume: Decreased  Handedness: Right   Mood and Affect  Mood: Depressed; Anxious  Affect: Congruent   Thought Process  Thought Processes: Coherent  Descriptions of  Associations:Intact  Orientation:Full (Time, Place and Person)  Thought Content:Logical  Diagnosis of Schizophrenia or Schizoaffective disorder in past: No    Hallucinations:Hallucinations: None  Ideas of Reference:None  Suicidal Thoughts:Suicidal Thoughts: No  Homicidal Thoughts:Homicidal Thoughts: No   Sensorium  Memory: Immediate Good; Recent Good; Remote Good  Judgment: Fair  Insight: Fair   Art therapist  Concentration: Good  Attention Span: Good  Recall: Good  Fund of Knowledge: Good  Language: Good   Psychomotor Activity  Psychomotor Activity: Psychomotor Activity: Normal   Assets  Assets: Communication Skills; Desire for Improvement; Physical Health; Resilience; Social Support; Health and safety inspector; Housing   Sleep  Sleep: Sleep: Poor Number of Hours of Sleep: 12   Nutritional Assessment (For OBS and FBC admissions only) Has the patient had a weight loss or gain of 10 pounds or more in the last 3 months?: No Has the patient had a decrease in food intake/or appetite?: Yes Does the patient have dental problems?: No Does the patient have eating habits or behaviors that may be indicators of an eating disorder including binging or inducing vomiting?: Yes Has the patient recently lost weight without trying?: 2.0 Has the patient been eating poorly because of a decreased appetite?: 1 Malnutrition Screening Tool Score: 3    Physical Exam  Physical Exam Vitals and nursing note reviewed.  Constitutional:      General: She is not in acute distress.    Appearance: Normal appearance. She is not ill-appearing.  Eyes:     General:        Right eye: No discharge.        Left eye: No discharge.  Cardiovascular:     Rate and Rhythm: Normal rate.  Pulmonary:     Effort: Pulmonary effort is normal.  Musculoskeletal:        General: Normal range of motion.     Cervical back: Normal range of motion.  Skin:    General: Skin is  warm and dry.  Neurological:     Mental Status: She is alert and oriented to person, place, and time.  Psychiatric:        Attention and Perception: Attention and perception normal.        Mood and Affect: Affect normal. Mood is anxious and depressed.        Speech: Speech normal.        Behavior: Behavior normal. Behavior is cooperative.        Thought Content: Thought content includes suicidal ideation.        Cognition and Memory: Cognition normal.        Judgment: Judgment normal.    Review of Systems  Constitutional: Negative.   HENT: Negative.    Eyes: Negative.   Respiratory: Negative.    Cardiovascular: Negative.   Musculoskeletal: Negative.   Skin: Negative.   Neurological: Negative.   Psychiatric/Behavioral:  Positive for depression and suicidal ideas.    Blood pressure 106/84, pulse 85,  temperature 98.3 F (36.8 C), temperature source Oral, resp. rate 19, SpO2 100%. There is no height or weight on file to calculate BMI.  Disposition: Discharge patient and readmit to [facility] for inpatient psychiatric admission. Admission orders placed  Ardis Hughs, NP 04/24/2023, 2:37 PM

## 2023-04-25 DIAGNOSIS — F322 Major depressive disorder, single episode, severe without psychotic features: Secondary | ICD-10-CM | POA: Diagnosis not present

## 2023-04-25 MED ORDER — MAGNESIUM HYDROXIDE 400 MG/5ML PO SUSP: 30.0000 mL | Freq: Every day | ORAL | Status: DC | PRN

## 2023-04-25 MED ORDER — ALUM & MAG HYDROXIDE-SIMETH 200-200-20 MG/5ML PO SUSP: 30.0000 mL | ORAL | Status: DC | PRN

## 2023-04-25 MED ORDER — HYDROXYZINE HCL 25 MG PO TABS
25.0000 mg | ORAL_TABLET | Freq: Three times a day (TID) | ORAL | Status: DC | PRN
  Administered 2023-04-26 – 2023-04-28 (×3): 25 mg via ORAL
  Filled 2023-04-25 (×4): qty 1
  Filled 2023-04-25: qty 3

## 2023-04-25 MED ORDER — FLUOXETINE HCL 20 MG PO CAPS
40.0000 mg | ORAL_CAPSULE | Freq: Every day | ORAL | Status: DC
  Administered 2023-04-26 – 2023-04-30 (×5): 40 mg via ORAL
  Filled 2023-04-25 (×7): qty 2

## 2023-04-25 MED ORDER — FLUOXETINE HCL 20 MG PO CAPS
20.0000 mg | ORAL_CAPSULE | Freq: Every day | ORAL | Status: DC
  Administered 2023-04-25: 20 mg via ORAL
  Filled 2023-04-25 (×3): qty 1

## 2023-04-25 MED ORDER — HALOPERIDOL LACTATE 5 MG/ML IJ SOLN: 5.0000 mg | Freq: Three times a day (TID) | INTRAMUSCULAR | Status: DC | PRN

## 2023-04-25 MED ORDER — DIPHENHYDRAMINE HCL 25 MG PO CAPS: 50.0000 mg | ORAL_CAPSULE | Freq: Three times a day (TID) | ORAL | Status: DC | PRN

## 2023-04-25 MED ORDER — LORAZEPAM 1 MG PO TABS: 2.0000 mg | ORAL_TABLET | Freq: Three times a day (TID) | ORAL | Status: DC | PRN

## 2023-04-25 MED ORDER — HALOPERIDOL 5 MG PO TABS: 5.0000 mg | ORAL_TABLET | Freq: Three times a day (TID) | ORAL | Status: DC | PRN

## 2023-04-25 MED ORDER — ARIPIPRAZOLE 2 MG PO TABS
2.0000 mg | ORAL_TABLET | Freq: Every day | ORAL | Status: DC
  Filled 2023-04-25 (×3): qty 1

## 2023-04-25 MED ORDER — FLUOXETINE HCL 20 MG PO CAPS
20.0000 mg | ORAL_CAPSULE | Freq: Once | ORAL | Status: AC
  Administered 2023-04-25: 20 mg via ORAL
  Filled 2023-04-25 (×2): qty 1

## 2023-04-25 MED ORDER — ACETAMINOPHEN 325 MG PO TABS: 650.0000 mg | ORAL_TABLET | Freq: Four times a day (QID) | ORAL | Status: DC | PRN

## 2023-04-25 MED ORDER — DIPHENHYDRAMINE HCL 50 MG/ML IJ SOLN: 50.0000 mg | Freq: Three times a day (TID) | INTRAMUSCULAR | Status: DC | PRN

## 2023-04-25 MED ORDER — TRAZODONE HCL 50 MG PO TABS
50.0000 mg | ORAL_TABLET | Freq: Every evening | ORAL | Status: DC | PRN
  Administered 2023-04-26 – 2023-04-28 (×3): 50 mg via ORAL
  Filled 2023-04-25 (×5): qty 1

## 2023-04-25 MED ORDER — PROPRANOLOL HCL 10 MG PO TABS
10.0000 mg | ORAL_TABLET | Freq: Two times a day (BID) | ORAL | Status: DC
  Administered 2023-04-25 – 2023-04-30 (×9): 10 mg via ORAL
  Filled 2023-04-25 (×5): qty 1
  Filled 2023-04-25: qty 2
  Filled 2023-04-25 (×2): qty 1
  Filled 2023-04-25: qty 2
  Filled 2023-04-25 (×7): qty 1

## 2023-04-25 MED ORDER — LORAZEPAM 2 MG/ML IJ SOLN: 2.0000 mg | Freq: Three times a day (TID) | INTRAMUSCULAR | Status: DC | PRN

## 2023-04-25 NOTE — Plan of Care (Signed)
Problem: Education: Goal: Knowledge of [facility] Education information/materials will improve Outcome: Progressing   Problem: Activity: Goal: Interest or engagement in activities will improve Outcome: Progressing   Problem: Coping: Goal: Ability to verbalize frustrations and anger appropriately will improve Outcome: Progressing   Problem: Physical Regulation: Goal: Ability to maintain clinical measurements within normal limits will improve Outcome: Progressing

## 2023-04-25 NOTE — BHH Group Notes (Signed)
[facility] Group Notes:  (Nursing/MHT/Case Management/Adjunct)  Date:  04/25/2023  Time:  8:51 PM  Type of Therapy:   Wrap Up Group  Participation Level:  Active  Participation Quality:  Appropriate and Attentive  Affect:  Appropriate  Cognitive:  Appropriate and Oriented  Insight:  Appropriate and Improving  Engagement in Group:  Engaged and Improving  Modes of Intervention:  Discussion  Summary of Progress/Problems: Patient actively participated in group. Patient stated " today was ok"  Pt. Says she accomplished her goal today and wants to participate more tomorrow.  Stacie Eaton 04/25/2023, 8:51 PM

## 2023-04-25 NOTE — Group Note (Signed)
Recreation Therapy Group Note   Group Topic:Animal Assisted Therapy   Group Date: 04/25/2023 Start Time: 0950 End Time: 1035 Facilitators: Billiejean Schimek, Benito Mccreedy, LRT Location: [address] Dayroom  Animal-Assisted Activity (AAA) Program Checklist/Progress Note Patient Eligibility Criteria Checklist & Daily Group note for Rec Tx Intervention   AAA/T Program Assumption of Risk Form signed by Patient/ or Parent Legal Guardian YES  Patient is free of allergies or severe asthma  YES  Patient reports no fear of animals YES  Patient reports no history of cruelty to animals YES  Patient understands their participation is voluntary YES  Patient washes hands before animal contact YES  Patient washes hands after animal contact YES   Group Description: Patients provided opportunity to interact with trained and credentialed [employer] Therapy dog and the community volunteer/dog handler. Patients practiced appropriate animal interaction and were educated on dog safety outside of the hospital in common community settings. Patients were allowed to use dog toys and other items to practice commands, engage the dog in play, and/or complete routine aspects of animal care.   Education: Charity fundraiser, Health visitor, Communication & Social Skills   Affect/Mood: Congruent and Euthymic   Participation Level: Engaged   Participation Quality: Independent   Behavior: Attentive , Calm, and Interactive    Speech/Thought Process: Coherent and Oriented   Modes of Intervention: Activity, Teaching laboratory technician, and Socialization   Patient Response to Interventions:  Interested    Education Outcome:  Acknowledges education   Clinical Observations/Individualized Feedback: Aeon attended AAA programming and interacted appropriately with the therapy dog, Dixie and peers. Pt spontaneously shared stories about their experiences with animals. Pt expressed that they a cat named Luther Parody as their  personal pet at home.   Benito Mccreedy Chey Rachels, LRT, CTRS 04/25/2023 2:15 PM
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
## 2023-04-25 NOTE — Progress Notes (Addendum)
D. Pt presents with a sad affect/ depressed mood- rated her depression an 8/10 this am. Pt observed attending group this afternoon.  Pt currently denies SI/HI, AVH and paranoia- and does not appear to be responding to internal stimuli. Pt was agreeable when reminded that she would need to be out of her room following meals. A. Labs and vitals monitored. Pt given and educated on medications. Pt supported emotionally and encouraged to express concerns and ask questions.   R. Pt remains safe with 15 minute checks. Will continue POC.

## 2023-04-25 NOTE — Group Note (Signed)
Date:  04/25/2023 Time:  2:36 AM  Group Topic/Focus:  Wrap-Up Group:   The focus of this group is to help patients review their daily goal of treatment and discuss progress on daily workbooks.    Participation Level:  Active  Participation Quality:  Appropriate and Sharing  Affect:  Appropriate  Cognitive:  Appropriate  Insight: Appropriate  Engagement in Group:  Engaged  Modes of Intervention:  Activity and Socialization  Additional Comments:  The patient described her day as being "so/so". The patented stated that was having some trouble with her leg this week (Saturday). The patient stated that it was her first official day on the unit and it went well that . The patient  rated her day 5/10. The patient participated in the group ice breaker/riddle activity after sharing.   Kennieth Francois 04/25/2023, 2:36 AM

## 2023-04-25 NOTE — Plan of Care (Signed)
Problem: Education: Goal: Knowledge of  General Education information/materials will improve Outcome: Progressing Goal: Emotional status will improve Outcome: Progressing Goal: Mental status will improve Outcome: Progressing

## 2023-04-25 NOTE — Progress Notes (Signed)
   04/25/23 1100  Psych Admission Type (Psych Patients Only)  Admission Status Voluntary  Psychosocial Assessment  Patient Complaints Depression  Eye Contact Fair  Facial Expression Sad  Affect Sad  Speech Logical/coherent  Interaction Minimal  Motor Activity Slow  Appearance/Hygiene Unremarkable  Behavior Characteristics Cooperative;Appropriate to situation  Mood Depressed  Thought Process  Coherency WDL  Content WDL  Delusions None reported or observed  Perception WDL  Hallucination None reported or observed  Judgment Impaired  Confusion None  Danger to Self  Current suicidal ideation? Denies  Self-Injurious Behavior No self-injurious ideation or behavior indicators observed or expressed   Agreement Not to Harm Self Yes  Description of Agreement verbal contract for safety  Danger to Others  Danger to Others None reported or observed

## 2023-04-25 NOTE — Group Note (Signed)
LCSW Group Therapy Note   Group Date: 04/25/2023 Start Time: 1100 End Time: 1200   Type of Therapy and Topic:  Group Therapy: Boundaries  Participation Level:  Did Not Attend  Description of Group: This group will address the use of boundaries in their personal lives. Patients will explore why boundaries are important, the difference between healthy and unhealthy boundaries, and negative and postive outcomes of different boundaries and will look at how boundaries can be crossed.  Patients will be encouraged to identify current boundaries in their own lives and identify what kind of boundary is being set. Facilitators will guide patients in utilizing problem-solving interventions to address and correct types boundaries being used and to address when no boundary is being used. Understanding and applying boundaries will be explored and addressed for obtaining and maintaining a balanced life. Patients will be encouraged to explore ways to assertively make their boundaries and needs known to significant others in their lives, using other group members and facilitator for role play, support, and feedback.  Therapeutic Goals:  1.  Patient will identify areas in their life where setting clear boundaries could be  used to improve their life.  2.  Patient will identify signs/triggers that a boundary is not being respected. 3.  Patient will identify two ways to set boundaries in order to achieve balance in  their lives: 4.  Patient will demonstrate ability to communicate their needs and set boundaries  through discussion and/or role plays  Therapeutic Modalities:   Cognitive Behavioral Therapy Solution-Focused Therapy  Ane Payment, LCSW 04/25/2023  1:15 PM
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
## 2023-04-25 NOTE — [facility] Group Notes (Signed)
[facility] Group Notes:  (Nursing/MHT/Case Management/Adjunct)  Date:  04/25/2023  Time:  9:23 AM  Type of Therapy:  Group Topic/ Focus: Goals Group: The focus of this group is to help patients establish daily goals to achieve during treatment and discuss how the patient can incorporate goal setting into their daily lives to aide in recovery.   Participation Level:  Did Not Attend  Summary of Progress/Problems:  Patient did not attend goals group today. Patient was encouraged but refused.   [name] 04/25/2023, 9:23 AM
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
## 2023-04-26 ENCOUNTER — Encounter (HOSPITAL_COMMUNITY): Payer: Self-pay

## 2023-04-26 DIAGNOSIS — F322 Major depressive disorder, single episode, severe without psychotic features: Secondary | ICD-10-CM | POA: Diagnosis not present

## 2023-04-26 MED ORDER — ARIPIPRAZOLE 2 MG PO TABS
2.0000 mg | ORAL_TABLET | Freq: Every day | ORAL | Status: DC
  Administered 2023-04-27 – 2023-04-28 (×2): 2 mg via ORAL
  Filled 2023-04-26 (×4): qty 1

## 2023-04-26 MED ORDER — ONDANSETRON 4 MG PO TBDP
4.0000 mg | ORAL_TABLET | Freq: Three times a day (TID) | ORAL | Status: DC | PRN
  Administered 2023-04-26: 4 mg via ORAL
  Filled 2023-04-26: qty 1

## 2023-04-26 NOTE — Progress Notes (Signed)
   04/26/23 1000  Psych Admission Type (Psych Patients Only)  Admission Status Voluntary  Psychosocial Assessment  Patient Complaints Depression  Eye Contact Brief  Facial Expression Anxious  Affect Sad  Speech Logical/coherent  Interaction Minimal  Motor Activity Slow  Appearance/Hygiene Unremarkable  Behavior Characteristics Cooperative;Appropriate to situation;Calm  Mood Depressed  Thought Process  Coherency WDL  Content WDL  Delusions None reported or observed  Perception WDL  Hallucination None reported or observed  Judgment Impaired  Confusion None  Danger to Self  Current suicidal ideation? Denies  Self-Injurious Behavior No self-injurious ideation or behavior indicators observed or expressed   Agreement Not to Harm Self Yes  Description of Agreement verbal  Danger to Others  Danger to Others None reported or observed

## 2023-04-26 NOTE — [facility] Group Notes (Signed)
[facility] Group Notes:  (Nursing/MHT/Case Management/Adjunct)  Date:  04/26/2023  Time:  8:53 PM  Type of Therapy:   Wrap Up Group  Participation Level:  Did Not Attend  Buffie Herne 04/26/2023, 8:53 PM

## 2023-04-26 NOTE — Plan of Care (Signed)
Problem: Education: Goal: Emotional status will improve Outcome: Progressing Goal: Mental status will improve Outcome: Progressing Goal: Verbalization of understanding the information provided will improve Outcome: Progressing   Problem: Activity: Goal: Interest or engagement in activities will improve Outcome: Progressing Goal: Sleeping patterns will improve Outcome: Progressing

## 2023-04-26 NOTE — Progress Notes (Signed)
   04/25/23 2316  Psych Admission Type (Psych Patients Only)  Admission Status Voluntary  Psychosocial Assessment  Patient Complaints Depression  Eye Contact Brief  Facial Expression Anxious  Affect Sad  Speech Logical/coherent  Interaction Minimal  Motor Activity Slow  Appearance/Hygiene Unremarkable  Behavior Characteristics Cooperative;Appropriate to situation  Mood Depressed  Thought Process  Coherency WDL  Content WDL  Delusions None reported or observed  Perception WDL  Hallucination None reported or observed  Judgment Impaired  Confusion None  Danger to Self  Current suicidal ideation? Denies  Self-Injurious Behavior No self-injurious ideation or behavior indicators observed or expressed   Agreement Not to Harm Self Yes  Description of Agreement verbal  Danger to Others  Danger to Others None reported or observed

## 2023-04-26 NOTE — Group Note (Signed)
Recreation Therapy Group Note   Group Topic:Leisure Education  Group Date: 04/26/2023 Start Time: 0935 End Time: 1025 Facilitators: Eymi Lipuma-McCall, LRT,CTRS Location: [address] Dayroom   Group Topic: Leisure Education   Goal Area(s) Addresses:  Patient will successfully identify positive leisure and recreation activities.  Patient will acknowledge benefits of participation in healthy leisure activities post discharge.  Patient will actively work with peers toward a shared goal.   Intervention: Group Game    Group Description: Music Trivia. LRT asked patients music questions about oldies but goodies and 90s, 2000s hip hop and r&b. In one group, patients worked together to figure out the next lyric to the songs presented in group. The activity was also used to show patients that leisure didn't have to be an expensive activity and can be enjoyed as a group as well as individually.    Education:  Teacher, English as a foreign language, Leisure as Merchant navy officer, Programmer, applications, Building control surveyor   Education Outcome: Acknowledges education/In group clarification offered/Needs additional education   Affect/Mood: N/A   Participation Level: Did not attend    Clinical Observations/Individualized Feedback:     Plan: Continue to engage patient in RT group sessions 2-3x/week.   Nadene Witherspoon-McCall, LRT,CTRS 04/26/2023 12:03 PM
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
## 2023-04-26 NOTE — [facility] Suicide Risk Assessment (Signed)
[facility] INPATIENT:  Family/Significant Other Suicide Prevention Education  Suicide Prevention Education:  Education Completed; Sonny Masters (husband) [phone], has been identified by the patient as the family member/significant other with whom the patient will be residing, and identified as the person(s) who will aid the patient in the event of a mental health crisis (suicidal ideations/suicide attempt).  With written consent from the patient, the family member/significant other has been provided the following suicide prevention education, prior to the and/or following the discharge of the patient.  The suicide prevention education provided includes the following: Suicide risk factors Suicide prevention and interventions National Suicide Hotline telephone number [facility] assessment telephone number [facility] Emergency Assistance 911 [facility] and/or Residential Mobile Crisis Unit telephone number  Request made of family/significant other to: Remove weapons (e.g., guns, rifles, knives), all items previously/currently identified as safety concern.   Remove drugs/medications (over-the-counter, prescriptions, illicit drugs), all items previously/currently identified as a safety concern.  The family member/significant other verbalizes understanding of the suicide prevention education information provided.  The family member/significant other agrees to remove the items of safety concern listed above.  Izell [place] 04/26/2023, 3:21 PM

## 2023-04-26 NOTE — Plan of Care (Signed)
Problem: Education: Goal: Knowledge of  General Education information/materials will improve Outcome: Progressing   Problem: Activity: Goal: Interest or engagement in activities will improve Outcome: Progressing   Problem: Health Behavior/Discharge Planning: Goal: Identification of resources available to assist in meeting health care needs will improve Outcome: Progressing   Problem: Physical Regulation: Goal: Ability to maintain clinical measurements within normal limits will improve Outcome: Progressing

## 2023-04-26 NOTE — Progress Notes (Signed)
   04/26/23 2319  Psych Admission Type (Psych Patients Only)  Admission Status Voluntary  Psychosocial Assessment  Patient Complaints Anxiety;Depression  Eye Contact Brief  Facial Expression Anxious  Affect Sad  Speech Logical/coherent  Interaction Minimal  Motor Activity Slow  Appearance/Hygiene Unremarkable  Behavior Characteristics Cooperative;Appropriate to situation  Mood Anxious;Depressed  Thought Process  Coherency WDL  Content WDL  Delusions None reported or observed  Perception WDL  Hallucination None reported or observed  Judgment Impaired  Confusion None  Danger to Self  Current suicidal ideation? Denies  Self-Injurious Behavior No self-injurious ideation or behavior indicators observed or expressed   Agreement Not to Harm Self Yes  Description of Agreement Verbal  Danger to Others  Danger to Others None reported or observed

## 2023-04-27 DIAGNOSIS — F322 Major depressive disorder, single episode, severe without psychotic features: Secondary | ICD-10-CM | POA: Diagnosis not present
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
## 2023-04-27 NOTE — Plan of Care (Signed)
  Problem: Education: Goal: Knowledge of [facility] Education information/materials will improve Outcome: Progressing Goal: Emotional status will improve Outcome: Progressing Goal: Mental status will improve Outcome: Progressing Goal: Verbalization of understanding the information provided will improve Outcome: Progressing   Problem: Activity: Goal: Interest or engagement in activities will improve Outcome: Progressing Goal: Sleeping patterns will improve Outcome: Progressing   Problem: Coping: Goal: Ability to verbalize frustrations and anger appropriately will improve Outcome: Progressing Goal: Ability to demonstrate self-control will improve Outcome: Progressing   Problem: Health Behavior/Discharge Planning: Goal: Identification of resources available to assist in meeting health care needs will improve Outcome: Progressing Goal: Compliance with treatment plan for underlying cause of condition will improve Outcome: Progressing

## 2023-04-27 NOTE — [facility] Group Notes (Signed)
[facility] Group Notes:  (Nursing/MHT/Case Management/Adjunct)  Date:  04/27/2023  Time:  8:27 PM  Type of Therapy:   Wrap Up group   Participation Level:  Active  Participation Quality:  Appropriate  Affect:  Appropriate  Cognitive:  Alert, Appropriate, and Oriented  Insight:  Appropriate and Good  Engagement in Group:  Engaged  Modes of Intervention:  Discussion  Summary of Progress/Problems:Pt shared her day had its ups and down but mainly a good day.   Stacie Eaton 04/27/2023, 8:27 PM

## 2023-04-27 NOTE — Plan of Care (Signed)
  Problem: Education: Goal: Knowledge of [facility] Education information/materials will improve Outcome: Progressing Goal: Emotional status will improve Outcome: Progressing Goal: Mental status will improve Outcome: Progressing Goal: Verbalization of understanding the information provided will improve Outcome: Progressing   

## 2023-04-27 NOTE — Group Note (Signed)
Date:  04/27/2023 Time:  10:08 AM  Group Topic/Focus:  Goals Group:   The focus of this group is to help patients establish daily goals to achieve during treatment and discuss how the patient can incorporate goal setting into their daily lives to aide in recovery.    Participation Level:  Active  Participation Quality:  Appropriate  Affect:  Appropriate  Cognitive:  Appropriate  Insight: Appropriate  Engagement in Group:  Engaged  Modes of Intervention:  Discussion  Additional Comments:     Reymundo Poll 04/27/2023, 10:08 AM

## 2023-04-27 NOTE — Progress Notes (Signed)
Patient denies SI, HI and AVH. Rates depression and anxiety both 5/10 and states feels stagnant--mood has not improved. Patient is pleasant and visible in the milieu. In the afternoon patient was more isolative to room and observed to be tearful. Patient stated pt missed family. Support and encouragement provided. Patient has adhered to room lockout after meals and has eaten adequate percentage of meals. Patient complained of nausea and cramping. Continuing to monitor for reaction to a different diet than patient is used to. Patient remains safe on the unit Q 15 minute safety checks ongoing.   04/27/23 0800  Psych Admission Type (Psych Patients Only)  Admission Status Voluntary  Psychosocial Assessment  Patient Complaints Anxiety;Depression  Eye Contact Fair  Facial Expression Anxious  Affect Sad  Speech Logical/coherent  Interaction Minimal  Motor Activity Slow  Appearance/Hygiene Unremarkable  Behavior Characteristics Cooperative;Appropriate to situation  Mood Anxious;Depressed  Thought Process  Coherency WDL  Content WDL  Delusions None reported or observed  Perception WDL  Hallucination None reported or observed  Judgment Impaired  Confusion None  Danger to Self  Current suicidal ideation? Denies  Self-Injurious Behavior No self-injurious ideation or behavior indicators observed or expressed   Agreement Not to Harm Self Yes  Description of Agreement verbal  Danger to Others  Danger to Others None reported or observed

## 2023-04-27 NOTE — [facility] Counselor (Signed)
  04/27/2023  2:32 PM   Stacie Eaton  Type of note: resources   CSW provided home school group resources to Pt.   Signed:  Marya Landry MSW, [facility] 04/27/2023  2:32 PM

## 2023-04-27 NOTE — Group Note (Signed)
LCSW Group Therapy Note   Group Date: 04/27/2023 Start Time: 1100 End Time: 1200  LCSW Group Therapy Note     04/27/2023 12:56 PM     Type of Therapy and Topic:  Group Therapy:  Strengths     Participation Level:  Minimal     Description of Group: In this group patients will be encouraged to explore personal strengths that are conducive to recovery and well-being. They will be guided to discuss their thoughts, feelings, and behaviors related to these strengths. The group will process together ways that individualized strengths help patients build resilience and facilitate positive treatment outcomes. Each patient will be challenged to identify personal strengths that they foster as well as positive characteristics they would like to embody as they progress through treatment.This group will be process-oriented, with patients participating in exploration of their own experiences as well as giving and receiving support and challenge from other group members.     Therapeutic Goals:  1.    Patient will identify a collaborative list of positive characteristics that promote positive treatment outcomes and well-being.  2.    Patient will identify three personal strengths that they currently exemplify.  3.    Patient will identify feelings, thought process and behaviors related to these strengths.  4.    Patient will identify two ways they will use these personal strengths to help them reach their individualized treatment goals.         Summary of Patient Progress    Pt attended group       Therapeutic Modalities:    Cognitive Behavioral Therapy  Solution Focused Therapy  Motivational Interviewing     Izell Cornish, Theresia Majors 04/27/2023  12:55 PM

## 2023-04-28 DIAGNOSIS — F322 Major depressive disorder, single episode, severe without psychotic features: Secondary | ICD-10-CM | POA: Diagnosis not present

## 2023-04-28 LAB — LIPID PANEL
Cholesterol: 184 mg/dL (ref 0–200)
HDL: 57 mg/dL (ref >40)
LDL Cholesterol: 109 mg/dL — ABNORMAL HIGH (ref 0–99)
Total CHOL/HDL Ratio: 3.2 RATIO
Triglycerides: 91 mg/dL (ref <150)
VLDL: 18 mg/dL (ref 0–40)

## 2023-04-28 LAB — HEMOGLOBIN A1C
Hgb A1c MFr Bld: 4.7 % — ABNORMAL LOW (ref 4.8–5.6)
Mean Plasma Glucose: 88.19 mg/dL

## 2023-04-28 MED ORDER — ARIPIPRAZOLE 5 MG PO TABS
5.0000 mg | ORAL_TABLET | Freq: Every day | ORAL | Status: DC
  Administered 2023-04-29 – 2023-04-30 (×2): 5 mg via ORAL
  Filled 2023-04-28 (×4): qty 1
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
## 2023-04-28 NOTE — [facility] Group Notes (Signed)
Adult Psychoeducational Group Note  Date:  04/28/2023 Time:  8:38 PM  Group Topic/Focus:  Wrap-Up Group:   The focus of this group is to help patients review their daily goal of treatment and discuss progress on daily workbooks.  Participation Level:  Active  Participation Quality:  Attentive  Affect:  Appropriate  Cognitive:  Alert  Insight: Improving  Engagement in Group:  Engaged  Modes of Intervention:  Discussion and Support  Additional Comments:  Pt attended and participated in group.  Maura Crandall Cassandra 04/28/2023, 8:38 PM

## 2023-04-28 NOTE — Progress Notes (Signed)
D: Patient is alert, oriented, and cooperative. Denies SI, HI, AVH, and verbally contracts for safety.    A: Scheduled medications administered per MD order. Support provided. Patient educated on safety on the unit and medications. Routine safety checks every 15 minutes. Patient stated understanding to tell nurse about any new physical symptoms. Patient understands to tell staff of any needs.     R: No adverse drug reactions noted. Patient remains safe at this time and will continue to monitor.    04/28/23 1100  Psych Admission Type (Psych Patients Only)  Admission Status Voluntary  Psychosocial Assessment  Patient Complaints Anxiety;Depression  Eye Contact Fair  Facial Expression Anxious  Affect Depressed  Speech Logical/coherent  Interaction Minimal  Motor Activity Slow  Appearance/Hygiene Unremarkable  Behavior Characteristics Cooperative;Appropriate to situation  Mood Depressed;Anxious  Thought Process  Coherency WDL  Content WDL  Delusions None reported or observed  Perception WDL  Hallucination None reported or observed  Judgment Impaired  Confusion None  Danger to Self  Current suicidal ideation? Denies  Self-Injurious Behavior No self-injurious ideation or behavior indicators observed or expressed   Agreement Not to Harm Self Yes  Description of Agreement verbal  Danger to Others  Danger to Others None reported or observed

## 2023-04-28 NOTE — Progress Notes (Signed)
   04/27/23 2047  Psych Admission Type (Psych Patients Only)  Admission Status Voluntary  Psychosocial Assessment  Patient Complaints Anxiety;Depression  Eye Contact Fair  Facial Expression Anxious  Affect Depressed  Speech Logical/coherent  Interaction Minimal  Motor Activity Slow  Appearance/Hygiene Unremarkable  Behavior Characteristics Appropriate to situation;Cooperative  Mood Anxious;Depressed  Aggressive Behavior  Effect No apparent injury  Thought Process  Coherency WDL  Content WDL  Delusions WDL  Perception WDL  Hallucination None reported or observed  Judgment Impaired  Confusion None  Danger to Self  Current suicidal ideation? Denies  Self-Injurious Behavior No self-injurious ideation or behavior indicators observed or expressed   Agreement Not to Harm Self Yes  Description of Agreement verbal  Danger to Others  Danger to Others None reported or observed

## 2023-04-28 NOTE — Group Note (Signed)
Recreation Therapy Group Note   Group Topic:Problem Solving  Group Date: 04/28/2023 Start Time: 0930 End Time: 0955 Facilitators: Britiany Silbernagel-McCall, LRT,CTRS Location: [address] Day[address]   Group Topic: Communication, Team Building, Problem Solving  Goal Area(s) Addresses:  Patient will effectively work with peer towards shared goal.  Patient will identify skills used to make activity successful.  Patient will identify how skills used during activity can be used to reach post d/c goals.   Intervention: STEM Activity  Group Description: Stage manager. In teams of 3-5, patients were given 12 plastic drinking straws and an equal length of masking tape. Using the materials provided, patients were asked to build a landing pad to catch a golf ball dropped from approximately 5 feet in the air. All materials were required to be used by the team in their design. LRT facilitated post-activity discussion.  Education: Pharmacist, community, Scientist, physiological, Discharge Planning   Education Outcome: Acknowledges education/In group clarification offered/Needs additional education.    Affect/Mood: N/A   Participation Level: Did not attend    Clinical Observations/Individualized Feedback:     Plan: Continue to engage patient in RT group sessions 2-3x/week.   Marleta Lapierre-McCall, LRT,CTRS 04/28/2023 12:23 PM

## 2023-04-28 NOTE — Progress Notes (Signed)
Pt reports eating 25% of her supper earlier because she didn't like the food. Pt reports feeling "over sleepy" from her medications although she reports sleeping well at night. Pt does feel that her mood has improved since admission. It is easier for her to get out of bed and her motivation to do things has been returning. She rates her anxiety and depression tonight a 2 on a scale of 0-10 (10 being the worst). Reports feeling less depressed since the social worker helped with the home schooling process of her kids. She denies experiencing any side effects from her medications. She has been calm and cooperative with care provided on the unit. Pt denies SI/HI and AVH. Active listening, reassurance, and support provided. Every 15 minutes safety checks continue. Pt's safety has been maintained.   04/28/23 2103  Psych Admission Type (Psych Patients Only)  Admission Status Voluntary  Psychosocial Assessment  Patient Complaints Anxiety;Depression  Eye Contact Fair  Facial Expression Anxious;Sad  Affect Anxious;Depressed;Sad  Speech Logical/coherent  Interaction Minimal;Forwards little  Motor Activity Slow  Appearance/Hygiene Unremarkable  Behavior Characteristics Cooperative;Appropriate to situation;Anxious  Mood Depressed;Anxious  Thought Process  Coherency WDL  Content WDL  Delusions None reported or observed  Perception WDL  Hallucination None reported or observed  Judgment Limited  Confusion None  Danger to Self  Current suicidal ideation? Denies  Self-Injurious Behavior No self-injurious ideation or behavior indicators observed or expressed   Agreement Not to Harm Self Yes  Description of Agreement verbally contracts for safety  Danger to Others  Danger to Others None reported or observed

## 2023-04-28 NOTE — [facility] Group Notes (Signed)
The focus of this group is to help patients establish daily goals to achieve during treatment and discuss how the patient can incorporate goal setting into their daily lives to aide in recovery.    Scale 1-10:  3   Goals:  Talking to Doctor about discharge plan

## 2023-04-29 DIAGNOSIS — F322 Major depressive disorder, single episode, severe without psychotic features: Secondary | ICD-10-CM | POA: Diagnosis not present
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
## 2023-04-29 NOTE — Progress Notes (Signed)
   04/29/23 0600  15 Minute Checks  Location Bedroom  Visual Appearance Calm  Behavior Sleeping  Sleep (Behavioral Health Patients Only)  Calculate sleep? (Click Yes once per 24 hr at 0600 safety check) Yes  Documented sleep last 24 hours 8

## 2023-04-29 NOTE — [facility] Group Notes (Signed)
[facility] Group Notes:  (Nursing/MHT/Case Management/Adjunct)  Date:  04/29/2023  Time:  8:35 PM  Type of Therapy:  The focus of this group is to help patients review their daily goal of treatment and discuss progress on daily workbooks.    Participation Level:  Active  Participation Quality:  Appropriate  Affect:  Appropriate  Cognitive:  Appropriate  Insight:  Appropriate  Engagement in Group:  Supportive  Modes of Intervention:  Socialization and Support  Summary of Progress/Problems: Pt attended group  Stacie Eaton 04/29/2023, 8:35 PM

## 2023-04-29 NOTE — [facility] Group Notes (Signed)
LCSW Wellness Group Note   04/29/2023 10:00am  Type of Group and Topic: Psychoeducational Group:  Wellness  Participation Level:  minimal  Description of Group  Wellness group introduces the topic and its focus on developing healthy habits across the spectrum and its relationship to a decrease in hospital admissions.  Six areas of wellness are discussed: physical, social spiritual, intellectual, occupational, and emotional.  Patients are asked to consider their current wellness habits and to identify areas of wellness where they are interested and able to focus on improvements.    Therapeutic Goals Patients will understand components of wellness and how they can positively impact overall health.  Patients will identify areas of wellness where they have developed good habits. Patients will identify areas of wellness where they would like to make improvements.    Summary of Patient Progress: pt attentive throughout group but did not participate in group discussion at all.  When called on by CSW, pt did identify spiritual and occupational as wellness areas of strength and social as a wellness area that needs improvement.      Therapeutic Modalities: Cognitive Behavioral Therapy Psychoeducation    Lorri Frederick, LCSW

## 2023-04-29 NOTE — Group Note (Signed)
Date:  04/29/2023 Time:  10:12 AM  Group Topic/Focus:  Goals Group:   The focus of this group is to help patients establish daily goals to achieve during treatment and discuss how the patient can incorporate goal setting into their daily lives to aide in recovery.    Participation Level:  Active  Participation Quality:  Appropriate  Affect:  Appropriate  Cognitive:  Appropriate  Insight: Appropriate  Engagement in Group:  Engaged  Modes of Intervention:  Discussion  Additional Comments:     Reymundo Poll 04/29/2023, 10:12 AM

## 2023-04-29 NOTE — Plan of Care (Signed)
°  Problem: Education: °Goal: Emotional status will improve °Outcome: Progressing °Goal: Mental status will improve °Outcome: Progressing °Goal: Verbalization of understanding the information provided will improve °Outcome: Progressing °  °

## 2023-04-29 NOTE — Progress Notes (Signed)
D- Patient alert and oriented.  Denies SI, HI, AVH, and pain. Rates anxiety and depression as 3/10 for each. Reports eating 100% of breakfast which consisted of bacon, oatmeal, and a muffin.  A- Scheduled medications administered to patient with the exception of propranolol which was held due to BP, per MAR. Support and encouragement provided.  Routine safety checks conducted every 15 minutes.  Patient informed to notify staff with problems or concerns.  R- No adverse drug reactions noted. Patient contracts for safety at this time. Patient compliant with medications and treatment plan. Patient receptive, calm, and cooperative. Patient interacts well with others on the unit.  Patient remains safe at this time.

## 2023-04-30 DIAGNOSIS — F322 Major depressive disorder, single episode, severe without psychotic features: Secondary | ICD-10-CM

## 2023-04-30 MED ORDER — TRAZODONE HCL 50 MG PO TABS
50.0000 mg | ORAL_TABLET | Freq: Every evening | ORAL | 0 refills | Status: AC | PRN
  Filled 2023-04-30: qty 30, 30d supply, fill #0

## 2023-04-30 MED ORDER — HYDROXYZINE HCL 25 MG PO TABS
25.0000 mg | ORAL_TABLET | Freq: Three times a day (TID) | ORAL | 0 refills | Status: AC | PRN
Start: 2023-04-30 — End: ?
  Filled 2023-04-30: qty 60, 20d supply, fill #0

## 2023-04-30 MED ORDER — FLUOXETINE HCL 40 MG PO CAPS
40.0000 mg | ORAL_CAPSULE | Freq: Every day | ORAL | 0 refills | Status: AC
  Filled 2023-04-30: qty 30, 30d supply, fill #0

## 2023-04-30 MED ORDER — DIPHENHYDRAMINE-ZINC ACETATE 2-0.1 % EX CREA
TOPICAL_CREAM | Freq: Every day | CUTANEOUS | 0 refills | Status: AC | PRN
Start: 2023-04-30 — End: ?
  Filled 2023-04-30: qty 28.4, fill #0

## 2023-04-30 MED ORDER — ARIPIPRAZOLE 5 MG PO TABS
5.0000 mg | ORAL_TABLET | Freq: Every day | ORAL | 0 refills | Status: AC
Start: 2023-05-01 — End: ?
  Filled 2023-04-30: qty 30, 30d supply, fill #0

## 2023-04-30 MED ORDER — DIPHENHYDRAMINE-ZINC ACETATE 2-0.1 % EX CREA: TOPICAL_CREAM | Freq: Every day | CUTANEOUS | Status: DC | PRN

## 2023-04-30 NOTE — Progress Notes (Signed)
D:  Patient denied SI and HI, contracts for safety.  Denied A/V hallucinations.  Denied pain. A:  Medications administered per MD orders.  Emotional support and encouragement given patient. R:  Safety maintained with 15 minute checks.

## 2023-04-30 NOTE — Plan of Care (Signed)
  Problem: Health Behavior/Discharge Planning: Goal: Identification of resources available to assist in meeting health care needs will improve Outcome: Progressing Goal: Compliance with treatment plan for underlying cause of condition will improve Outcome: Progressing  Patient is complaint with medication PRN vistaril given for anxiety and Trazodone for sleep. Medications were effective.   Q 15 minutes safety checks ongoing. Patient remains safe.

## 2023-04-30 NOTE — Progress Notes (Signed)
Discharge Note:  Patient discharged home with family member.  Suicide prevention information given and discussed with patient who stated she understood and had no questions.  Denied SI and HI.  Denied A/V her belongings, clothing, toiletries, misc items, etc.  All required discharge information given.
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
## 2023-04-30 NOTE — Progress Notes (Signed)
   04/30/23 0000  Psych Admission Type (Psych Patients Only)  Admission Status Voluntary  Psychosocial Assessment  Patient Complaints Anxiety  Eye Contact Fair  Facial Expression Anxious  Affect Anxious;Depressed  Speech Logical/coherent  Interaction Minimal  Motor Activity Slow  Appearance/Hygiene Unremarkable  Behavior Characteristics Cooperative  Mood Depressed;Anxious  Thought Process  Coherency WDL  Content WDL  Delusions None reported or observed  Perception WDL  Hallucination None reported or observed  Judgment Limited  Confusion None  Danger to Self  Current suicidal ideation? Denies (Denies)  Agreement Not to Harm Self Yes  Description of Agreement verbal  Danger to Others  Danger to Others None reported or observed

## 2023-04-30 NOTE — Plan of Care (Signed)
Nurse discussed anxiety, depression and coping skills with patient.  

## 2023-04-30 NOTE — [facility] Group Notes (Signed)
[facility] Group Notes:  (Nursing/MHT/Case Management/Adjunct)  Date:  04/30/2023  Time:  9:05 AM  Type of Therapy:   Goal group  Participation Level:  Active  Participation Quality:  Appropriate  Affect:  Appropriate  Cognitive:  Appropriate  Insight:  Appropriate  Engagement in Group:  Engaged  Modes of Intervention:  Orientation  Summary of Progress/Problems: Goal is to discharge today  Azalee Course 04/30/2023, 9:05 AM

## 2023-04-30 NOTE — BHH Suicide Risk Assessment (Signed)
[facility] Discharge Suicide Risk Assessment   Principal Problem: MDD (major depressive disorder), severe (HCC) Discharge Diagnoses: Principal Problem:   MDD (major depressive disorder), severe (HCC)   Total Time spent with patient: 45 minutes  Reason for admission:  Stacie Eaton is a 27 y.o. female with a past psychiatric history of major depressive disorder, generalized anxiety disorder and anorexia nervosa, purging type who presented to the [facility] on 9/15 for worsening depression, suicidal ideations and self injurious behavior. She was admitted to the [facility] on 9/16 for inpatient stabilization and medication management.   PTA Medications:  - Olanzapine 5 mg  - Fluoxetine 20 mg daily  - Hydroxyzine 25 mg TID  prn  - Propanolol 10 mg BID daily   Hospital Course:   During the patient's hospitalization, patient had extensive initial psychiatric evaluation, and follow-up psychiatric evaluations every day.  Psychiatric diagnoses provided upon initial assessment: Major Depressive disorder, recurrent, severe  Generalized Anxiety Disorder  R/o Anorexia Nervosa vs Bulimia, purging type   Patient's psychiatric medications were adjusted on admission:  - Home regimen is 20 mg Prozac daily              - Increase Prozac to 40 mg daily tomorrow, will receive additional 20 mg dose today  - Consider starting Abilify 2 mg daily in a few days to augment for depression and paranoia   During the hospitalization, other adjustments were made to the patient's psychiatric medication regimen: Prozac was titrated to 40 mg daily to address depression and anxiety, Abilify was titrated to 5 mg daily to augment antidepressant effect, Inderal was continued 10 mg twice daily for anxiety and tachycardia, hydroxyzine was continued as needed for anxiety and trazodone was continued as needed for sleep  Patient's care was discussed during the interdisciplinary team meeting every day during the hospitalization.  The patient  denied having side effects to prescribed psychiatric medication except for having abdominal cramping for 1 to 2 days after admission.  Gradually, patient started adjusting to milieu. The patient was evaluated each day by a clinical provider to ascertain response to treatment. Improvement was noted by the patient's report of decreasing symptoms, improved sleep and appetite, affect, medication tolerance, behavior, and participation in unit programming.  Patient was asked each day to complete a self inventory noting mood, mental status, pain, new symptoms, anxiety and concerns.    Symptoms were reported as significantly decreased or resolved completely by discharge.   On day of discharge, patient was evaluated on 9/22 the patient reports that their mood is stable. The patient denied having suicidal thoughts for more than 48 hours prior to discharge.  Patient denies having homicidal thoughts.  Patient denies having auditory hallucinations.  Patient denies any visual hallucinations or other symptoms of psychosis. The patient was motivated to continue taking medication with a goal of continued improvement in mental health.  Patient was able to discuss coping skills with stressors in the process plan if worsening depression or recurring SI after discharge. The patient reports their target psychiatric symptoms of increased depression and anxiety, SI responded well to the psychiatric medications, and the patient reports overall benefit other psychiatric hospitalization. Supportive psychotherapy was provided to the patient. The patient also participated in regular group therapy while hospitalized. Coping skills, problem solving as well as relaxation therapies were also part of the unit programming.  Labs were reviewed with the patient, and abnormal results were discussed with the patient.  The patient is able to verbalize their individual safety plan  to this provider.  Behavioral Events: None  Restraints:  None  Groups: Attended and participated  Medications Changes: As above  Sleep  Sleep: Good, improved during hospital stay  Musculoskeletal: Strength & Muscle Tone: within normal limits Gait & Station: normal Patient leans: N/A  Psychiatric Specialty Exam  General Appearance: appears at stated age, fairly dressed and groomed  Behavior: pleasant and cooperative  Psychomotor Activity:No psychomotor agitation or retardation noted   Eye Contact: good Speech: normal amount, tone, volume and latency   Mood: euthymic Affect: congruent, pleasant and interactive  Thought Process: linear, goal directed, no circumstantial or tangential thought process noted, no racing thoughts or flight of ideas Descriptions of Associations: intact Thought Content: Hallucinations: denies AH, VH , does not appear responding to stimuli Delusions: No paranoia or other delusions noted Suicidal Thoughts: denies SI, intention, plan  Homicidal Thoughts: denies HI, intention, plan   Alertness/Orientation: alert and fully oriented  Insight: fair, improved Judgment: fair, improved  Memory: intact  Executive Functions  Concentration: intact  Attention Span: Fair Recall: intact Fund of Knowledge: fair   Art therapist  Concentration: intact Attention Span: Fair Recall: intact Fund of Knowledge: fair   Assets  Assets: Manufacturing systems engineer; Desire for Improvement; Financial Resources/Insurance; Housing; Intimacy; Talents/Skills; Social Support; Vocational/Educational   Physical Exam: Physical Exam ROS Blood pressure 110/73, pulse (!) 123, temperature 98.4 F (36.9 C), resp. rate 16, SpO2 100%. There is no height or weight on file to calculate BMI.  Mental Status Per Nursing Assessment::   On Admission:  Suicidal ideation indicated by patient  Demographic Factors:  Adolescent or young adult and Caucasian  Loss Factors: NA  Historical Factors: NA  Risk Reduction Factors:    Responsible for children under 74 years of age, Sense of responsibility to family, Living with another person, especially a relative, Positive social support, Positive therapeutic relationship, and Positive coping skills or problem solving skills  Continued Clinical Symptoms: Symptoms improved significantly during hospital stay Depression:   Anhedonia Hopelessness Impulsivity Insomnia  Cognitive Features That Contribute To Risk:  None    Suicide Risk:  Minimal: No identifiable suicidal ideation.  Patients presenting with no risk factors but with morbid ruminations; may be classified as minimal risk based on the severity of the depressive symptoms   Follow-up Information     [facility] Follow up on 05/08/2023.   Why: You have a hospital follow up appointment for therapy and medication management services on 05/08/23 at 8:30 am.  This will be a Virtual telehealth appt. Contact information: [address]  [address] [place] [address] [phone]                 Plan Of Care/Follow-up recommendations:    Discharge recommendations:     Activity: as tolerated  Diet: heart healthy  # It is recommended to the patient to continue psychiatric medications as prescribed, after discharge from the hospital.     # It is recommended to the patient to follow up with your outpatient psychiatric provider and PCP.   # It was discussed with the patient, the impact of alcohol, drugs, tobacco have been there overall psychiatric and medical wellbeing, and total abstinence from substance use was recommended the patient.ed.   # Prescriptions provided or sent directly to preferred pharmacy at discharge. Patient agreeable to plan. Given opportunity to ask questions. Appears to feel comfortable with discharge.    # In the event of worsening symptoms, the patient is instructed to call the crisis hotline,  911 and or go to the nearest ED for appropriate evaluation and treatment of symptoms.  To follow-up with primary care provider for other medical issues, concerns and or health care needs   # Patient was discharged home with a plan to follow up as noted above.   Patient agrees with D/C instructions and plan.  The patient received suicide prevention pamphlet:  Yes Belongings returned:  Clothing and Valuables  Total Time Spent in Direct Patient Care:  I personally spent 45 minutes on the unit in direct patient care. The direct patient care time included face-to-face time with the patient, reviewing the patient's chart, communicating with other professionals, and coordinating care. Greater than 50% of this time was spent in counseling or coordinating care with the patient regarding goals of hospitalization, psycho-education, and discharge planning needs.   Tj Kitchings Abbott Pao 04/30/2023, 9:28 AM   Vester Titsworth Abbott Pao, MD 04/30/2023, 9:28 AM

## 2023-04-30 NOTE — Progress Notes (Signed)
  [facility] Adult Case Management Discharge Plan :  Will you be returning to the same living situation after discharge:  Yes,  Patient will return residing with spouse At discharge, do you have transportation home?: Yes,  Spouse Do you have the ability to pay for your medications: Yes,  Patient has medical coverage  Release of information consent forms completed and in the chart;  Patient's signature needed at discharge.  Patient to Follow up at:  Follow-up Information     [facility] Follow up on 05/08/2023.   Why: You have a hospital follow up appointment for therapy and medication management services on 05/08/23 at 8:30 am.  This will be a Virtual telehealth appt. Contact information: [address]  [address] [place] [address] [phone]                 Next level of care provider has access to [facility] Link:no  Safety Planning and Suicide Prevention discussed: Yes,  Completed with Spouse     Has patient been referred to the Quitline?: Patient does not use tobacco/nicotine products  Patient has been referred for addiction treatment: Patient refused referral for treatment.  Bridgett Larsson, LCSW 04/30/2023, 9:28 AM

## 2023-05-01 ENCOUNTER — Other Ambulatory Visit: Payer: Self-pay

## 2023-05-01 ENCOUNTER — Other Ambulatory Visit (HOSPITAL_COMMUNITY): Payer: Self-pay

## 2023-05-01 NOTE — [facility]HH Group Notes (Signed)
Spiritual care group on grief and loss facilitated by Chaplain Dyanne Carrel, Bcc and Arlyce Dice, Mdiv  Group Goal: Support / Education around grief and loss  Members engage in facilitated group support and psycho-social education.  Group Description:  Following introductions and group rules, group members engaged in facilitated group dialogue and support around topic of loss, with particular support around experiences of loss in their lives. Group Identified types of loss (relationships / self / things) and identified patterns, circumstances, and changes that precipitate losses. Reflected on thoughts / feelings around loss, normalized grief responses, and recognized variety in grief experience. Group encouraged individual reflection on safe space and on the coping skills that they are already utilizing.  Group drew on Adlerian / Rogerian and narrative framework  Patient Progress: Stacie Eaton attended group and actively engaged and participated in group conversation and activities.

## 2023-05-04 ENCOUNTER — Other Ambulatory Visit (HOSPITAL_COMMUNITY): Payer: Self-pay

## 2023-05-04 ENCOUNTER — Encounter: Payer: Self-pay | Admitting: Nurse Practitioner

## 2023-05-04 ENCOUNTER — Ambulatory Visit (INDEPENDENT_AMBULATORY_CARE_PROVIDER_SITE_OTHER): Payer: Self-pay | Admitting: Nurse Practitioner

## 2023-05-04 ENCOUNTER — Other Ambulatory Visit: Payer: Self-pay

## 2023-05-04 VITALS — BP 96/73 | HR 99 | Temp 97.9°F | Ht 61.0 in | Wt 126.0 lb

## 2023-05-04 DIAGNOSIS — R9431 Abnormal electrocardiogram [ECG] [EKG]: Secondary | ICD-10-CM

## 2023-05-04 DIAGNOSIS — R300 Dysuria: Secondary | ICD-10-CM

## 2023-05-04 DIAGNOSIS — N390 Urinary tract infection, site not specified: Secondary | ICD-10-CM

## 2023-05-04 LAB — POCT URINALYSIS DIP (CLINITEK)
Glucose, UA: NEGATIVE mg/dL
Nitrite, UA: NEGATIVE
POC PROTEIN,UA: 30 — AB
Spec Grav, UA: 1.025 (ref 1.010–1.025)
Urobilinogen, UA: 0.2 E.U./dL
pH, UA: 6 (ref 5.0–8.0)

## 2023-05-04 MED ORDER — CEPHALEXIN 500 MG PO CAPS
500.0000 mg | ORAL_CAPSULE | Freq: Three times a day (TID) | ORAL | 0 refills | Status: AC
Start: 2023-05-04 — End: 2023-05-14
  Filled 2023-05-04: qty 30, 10d supply, fill #0

## 2023-05-04 NOTE — Patient Instructions (Signed)
1. Dysuria  - POCT URINALYSIS DIP (CLINITEK)  2. Urinary tract infection without hematuria, site unspecified  - cephALEXin (KEFLEX) 500 MG capsule; Take 1 capsule (500 mg total) by mouth 3 (three) times daily for 10 days.  Dispense: 30 capsule; Refill: 0 - Urine Culture  3. Abnormal EKG  - EKG 12-Lead  Follow up:  Follow up in 2 weeks

## 2023-05-04 NOTE — Progress Notes (Signed)
Subjective   Patient ID: Stacie Eaton, female    DOB: 1996-04-25, 27 y.o.   MRN: [id number]  Chief Complaint  Patient presents with   Follow-up    Concern of low pulse & BP.  Abdominal pain, pressure when urinating, yellow vaginal discharge X1 weeks    Referring provider: Ivonne Andrew, NP  Jacqline Kubota is a 27 y.o. female with Past Medical History: No date: Anorexia No date: Anxiety No date: Bulimia No date: Depression     Comment:  postpartum No date: Urticaria   HPI  Patient presents today for hospital follow-up.  She was admitted to the behavioral health hospital on 04/24/2023 for anxiety and depression.  It was noted that her UA was abnormal but patient was not started on antibiotic.  She is symptomatic and repeat UA in office today does show concern for UTI.  We will send urine for culture.  We will go ahead and start patient on Keflex.  Patient with concern because she saw in the chart that her EKG was abnormal.  Overall it did show normal sinus rhythm but upon exam patient's heart is normal in normal rhythm today.  Blood pressure is low.  We will decrease propranolol to half a tablet twice daily.  We will recheck EKG today. Denies f/c/s, n/v/d, hemoptysis, PND, leg swelling Denies chest pain or edema  EKG: EKG was abnormal in office today.  It did show frequent PVCs and prolonged QT interval. Will place referral to psychiatry  No Known Allergies  Immunization History  Administered Date(s) Administered   Influenza, Seasonal, Injecte, Preservative Fre 04/25/2023    Tobacco History: Social History   Tobacco Use  Smoking Status Never  Smokeless Tobacco Never   Counseling given: Not Answered   Outpatient Encounter Medications as of 05/04/2023  Medication Sig   ARIPiprazole (ABILIFY) 5 MG tablet Take 1 tablet (5 mg total) by mouth daily.   cephALEXin (KEFLEX) 500 MG capsule Take 1 capsule (500 mg total) by mouth 3 (three) times daily for 10 days.    FLUoxetine (PROZAC) 40 MG capsule Take 1 capsule (40 mg total) by mouth daily.   hydrOXYzine (ATARAX) 25 MG tablet Take 1 tablet (25 mg total) by mouth 3 (three) times daily as needed for anxiety.   norgestimate-ethinyl estradiol (ORTHO-CYCLEN) 0.25-35 MG-MCG tablet Take 1 tablet by mouth daily.   propranolol (INDERAL) 10 MG tablet Take 1 tablet (10 mg total) by mouth 2 (two) times daily.   traZODone (DESYREL) 50 MG tablet Take 1 tablet (50 mg total) by mouth at bedtime as needed for sleep.   diphenhydrAMINE-zinc acetate (BENADRYL) cream Apply topically daily as needed for itching. (Patient not taking: Reported on 05/04/2023)   No facility-administered encounter medications on file as of 05/04/2023.    Review of Systems  Review of Systems  Constitutional: Negative.   HENT: Negative.    Cardiovascular: Negative.   Gastrointestinal: Negative.   Allergic/Immunologic: Negative.   Neurological: Negative.   Psychiatric/Behavioral: Negative.       Objective:   BP 96/73 (BP Location: Left Arm, Patient Position: Sitting, Cuff Size: Normal)   Pulse 99   Temp 97.9 F (36.6 C) (Oral)   Ht 5\' 1"  (1.549 m)   Wt 126 lb (57.2 kg)   SpO2 99%   BMI 23.81 kg/m   Wt Readings from Last 5 Encounters:  05/04/23 126 lb (57.2 kg)  02/23/23 115 lb 3.2 oz (52.3 kg)  02/06/23 105 lb 9.6 oz (47.9 kg)  01/19/23  91 lb 3.4 oz (41.4 kg)  01/18/23 95 lb 0.3 oz (43.1 kg)     Physical Exam Vitals and nursing note reviewed.  Constitutional:      General: She is not in acute distress.    Appearance: She is well-developed.  Cardiovascular:     Rate and Rhythm: Normal rate. Rhythm irregular.  Pulmonary:     Effort: Pulmonary effort is normal.     Breath sounds: Normal breath sounds.  Neurological:     Mental Status: She is alert and oriented to person, place, and time.       Assessment & Plan:   Dysuria -     POCT URINALYSIS DIP (CLINITEK)  Urinary tract infection without hematuria, site  unspecified -     Cephalexin; Take 1 capsule (500 mg total) by mouth 3 (three) times daily for 10 days.  Dispense: 30 capsule; Refill: 0 -     Urine Culture  Abnormal EKG -     EKG 12-Lead -     Ambulatory referral to Cardiology     Return in about 2 weeks (around 05/18/2023) for recheck BP / HR / UA.   Ivonne Andrew, NP 05/04/2023

## 2023-05-06 LAB — URINE CULTURE

## 2023-05-18 ENCOUNTER — Ambulatory Visit: Payer: Self-pay | Admitting: Nurse Practitioner

## 2023-07-14 ENCOUNTER — Ambulatory Visit: Payer: MEDICAID | Attending: Cardiology | Admitting: Cardiology

## 2023-07-14 DIAGNOSIS — I493 Ventricular premature depolarization: Secondary | ICD-10-CM | POA: Insufficient documentation

## 2023-07-14 DIAGNOSIS — R9431 Abnormal electrocardiogram [ECG] [EKG]: Secondary | ICD-10-CM | POA: Insufficient documentation

## 2023-07-14 NOTE — Progress Notes (Unsigned)
  Cardiology Office Note:  .   Date:  07/14/2023  ID:  Stacie Eaton, DOB 05-19-1996, MRN [id number] PCP: Ivonne Andrew, NP  [facility] Providers Cardiologist:  Truett Mainland, MD PCP: Ivonne Andrew, NP  No chief complaint on file.     History of Present Illness: .    Stacie Eaton is a 27 y.o. female with ***  There were no vitals filed for this visit.   ROS: *** ROS   Studies Reviewed: .        *** Independently interpreted EKG 05/04/2023: Sinus rhythm Frequent PVCs Prolonged QT interval  Independently interpreted 04/2023: Chol 184, TG 91, HDL 57, LDL 109 HbA1C 4.7% Hb 13.2 Cr 0.6   Risk Assessment/Calculations:   {Does this patient have ATRIAL FIBRILLATION?[phone]}     Physical Exam:   Physical Exam   VISIT DIAGNOSES: No diagnosis found.   ASSESSMENT AND PLAN: .    Stacie Eaton is a 27 y.o. female with ***  {Are you ordering a CV Procedure (e.g. stress test, cath, DCCV, TEE, etc)?   Press F2        :[id number]}    No orders of the defined types were placed in this encounter.    F/u in ***  Signed, Stacie Negus, MD

## 2023-07-17 ENCOUNTER — Encounter: Payer: Self-pay | Admitting: Cardiology

## 2023-08-24 ENCOUNTER — Ambulatory Visit: Payer: Self-pay | Admitting: Nurse Practitioner
